# Patient Record
Sex: Female | Born: 1960 | Race: Black or African American | Hispanic: No | Smoking: Current every day smoker
Health system: Southern US, Community
[De-identification: ages and names within clinical notes are randomized; demographics above are authoritative.]

## PROBLEM LIST (undated history)

## (undated) DIAGNOSIS — I10 Essential (primary) hypertension: Secondary | ICD-10-CM

## (undated) DIAGNOSIS — E119 Type 2 diabetes mellitus without complications: Secondary | ICD-10-CM

## (undated) HISTORY — PX: CHOLECYSTECTOMY: SHX55

---

## 2019-07-08 ENCOUNTER — Emergency Department (HOSPITAL_COMMUNITY): Payer: PRIVATE HEALTH INSURANCE

## 2019-07-08 ENCOUNTER — Other Ambulatory Visit: Payer: Self-pay

## 2019-07-08 ENCOUNTER — Emergency Department (HOSPITAL_COMMUNITY)
Admission: EM | Admit: 2019-07-08 | Discharge: 2019-07-08 | Disposition: A | Discharge status: Home / Self Care | Payer: PRIVATE HEALTH INSURANCE | Attending: Emergency Medicine | Admitting: Emergency Medicine

## 2019-07-08 DIAGNOSIS — I10 Essential (primary) hypertension: Secondary | ICD-10-CM | POA: Insufficient documentation

## 2019-07-08 DIAGNOSIS — E1165 Type 2 diabetes mellitus with hyperglycemia: Secondary | ICD-10-CM | POA: Diagnosis not present

## 2019-07-08 DIAGNOSIS — Z794 Long term (current) use of insulin: Secondary | ICD-10-CM | POA: Diagnosis not present

## 2019-07-08 DIAGNOSIS — Z7982 Long term (current) use of aspirin: Secondary | ICD-10-CM | POA: Insufficient documentation

## 2019-07-08 DIAGNOSIS — Z79899 Other long term (current) drug therapy: Secondary | ICD-10-CM | POA: Diagnosis not present

## 2019-07-08 DIAGNOSIS — R079 Chest pain, unspecified: Secondary | ICD-10-CM | POA: Insufficient documentation

## 2019-07-08 DIAGNOSIS — R739 Hyperglycemia, unspecified: Secondary | ICD-10-CM

## 2019-07-08 LAB — CBC
HCT: 36.6 % (ref 36.0–46.0)
Hemoglobin: 11.4 g/dL — ABNORMAL LOW (ref 12.0–15.0)
MCH: 26.5 pg (ref 26.0–34.0)
MCHC: 31.1 g/dL (ref 30.0–36.0)
MCV: 85.1 fL (ref 80.0–100.0)
Platelets: 318 10*3/uL (ref 150–400)
RBC: 4.3 MIL/uL (ref 3.87–5.11)
RDW: 13.2 % (ref 11.5–15.5)
WBC: 8.3 10*3/uL (ref 4.0–10.5)
nRBC: 0 % (ref 0.0–0.2)

## 2019-07-08 LAB — BASIC METABOLIC PANEL
Anion gap: 7 (ref 5–15)
BUN: 23 mg/dL — ABNORMAL HIGH (ref 6–20)
CO2: 24 mmol/L (ref 22–32)
Calcium: 9 mg/dL (ref 8.9–10.3)
Chloride: 103 mmol/L (ref 98–111)
Creatinine, Ser: 0.99 mg/dL (ref 0.44–1.00)
GFR calc Af Amer: >60 mL/min (ref >60)
GFR calc non Af Amer: >60 mL/min (ref >60)
Glucose, Bld: 324 mg/dL — ABNORMAL HIGH (ref 70–99)
Potassium: 4 mmol/L (ref 3.5–5.1)
Sodium: 134 mmol/L — ABNORMAL LOW (ref 135–145)

## 2019-07-08 LAB — TROPONIN I (HIGH SENSITIVITY)
Troponin I (High Sensitivity): 7 ng/L (ref <18)
Troponin I (High Sensitivity): 8 ng/L (ref <18)

## 2019-07-08 LAB — CBG MONITORING, ED: Glucose-Capillary: 323 mg/dL — ABNORMAL HIGH (ref 70–99)

## 2019-07-08 MED ORDER — SODIUM CHLORIDE 0.9% FLUSH: 3.0000 mL | Freq: Once | INTRAVENOUS | Status: DC

## 2019-07-08 NOTE — Discharge Instructions (Signed)
See your Physician for recheck.  °

## 2019-07-08 NOTE — ED Triage Notes (Signed)
Pt is having abdominal pain. Within the last week, she is having a nagging pain. Chest pain has been going on for a while too and was being treated for a strained muscle. Has been prescribed Ibuprofen and a muscle relaxer. History of heart burn as well. Pt taking Omeprazole. Now chest pain hurts with activity.

## 2019-07-09 NOTE — ED Provider Notes (Signed)
[facility] EMERGENCY DEPARTMENT Provider Note   CSN: [id number] Arrival date & time: 07/08/19  1206     History   Chief Complaint Chief Complaint  Patient presents with  . Chest Pain    HPI Robin Chandler is a 58 y.o. female.     Patient reports she has been having pain in her chest for over a month she has been treated for a muscle strain.  Patient reports pain is in the mid chest.  Patient also reports she has a history of reflux her primary care doctor has recently started her on omeprazole.  Patient states this seems to be helping the symptoms.  Patient has pain in the mid and left chest no radiation she describes the pain as aching and constant it is worse with range of motionPatient reports she has been having pain in her chest for over a month she has been treated for a muscle strain.  Patient reports pain is in the mid chest.  Patient also reports she has a history of reflux her primary care doctor has recently started her on omeprazole.  Patient states this seems to be helping the symptoms.  Patient has pain in the mid and left chest no radiation she describes the pain as aching and constant it is worse with range of motion   Chest Pain    Chest Pain Risk factors: diabetes mellitus and hypertension     No past medical history on file.  There are no active problems to display for this patient.      OB History   No obstetric history on file.      Home Medications    Prior to Admission medications   Medication Sig Start Date End Date Taking? Authorizing Provider  aspirin EC 81 MG tablet Take 81 mg by mouth daily.   Yes [provider]  busPIRone (BUSPAR) 7.5 MG tablet Take 7.5 mg by mouth daily. 06/25/19  Yes [provider]  cyclobenzaprine (FLEXERIL) 5 MG tablet Take 5 mg by mouth 3 (three) times daily as needed for muscle spasms.  06/10/19  Yes [provider]  glimepiride (AMARYL) 4 MG tablet Take 4 mg by mouth 2 (two) times daily.  05/25/19  Yes [provider]  hydrOXYzine (ATARAX/VISTARIL) 25 MG tablet Take 25-50 mg by mouth every 6 (six) hours as needed. For anxiety 05/30/19  Yes [provider]  Insulin Glargine (BASAGLAR KWIKPEN) 100 UNIT/ML SOPN Inject 20 Units into the skin at bedtime.  06/14/19  Yes [provider]  JANUMET 50-1000 MG tablet Take 1 tablet by mouth 2 (two) times daily. 06/24/19  Yes [provider]  lisinopril (ZESTRIL) 20 MG tablet Take 20 mg by mouth daily. 06/13/19  Yes [provider]  omeprazole (PRILOSEC) 40 MG capsule Take 40 mg by mouth daily. 06/02/19  Yes [provider]  rosuvastatin (CRESTOR) 20 MG tablet Take 20 mg by mouth daily. 04/19/19  Yes [provider]  vitamin C (ASCORBIC ACID) 500 MG tablet Take 500 mg by mouth daily.   Yes [provider]  zolpidem (AMBIEN) 10 MG tablet Take 10 mg by mouth at bedtime. 06/02/19  Yes [provider]    Family History No family history on file.  Social History Social History   Tobacco Use  . Smoking status: Not on file  Substance Use Topics  . Alcohol use: Not on file  . Drug use: Not on file     Allergies   Patient has  no known allergies.   Review of Systems Review of Systems  Cardiovascular: Positive for chest pain.  All other systems reviewed and are negative.    Physical Exam Updated Vital Signs BP (!) 146/85   Pulse 87   Temp 98 F (36.7 C) (Oral)   Resp 18   SpO2 100%   Physical Exam Vitals signs and nursing note reviewed.  Constitutional:      Appearance: She is well-developed.  HENT:     Head: Normocephalic.  Neck:     Musculoskeletal: Normal range of motion and neck supple.  Cardiovascular:     Rate and Rhythm: Normal rate.     Heart sounds: Normal heart sounds. No murmur.  Pulmonary:     Effort: Pulmonary effort is normal.     Breath sounds: Normal breath sounds.  Chest:     Chest wall: Tenderness present. No mass.  Abdominal:      General: There is no distension.     Palpations: Abdomen is soft.  Musculoskeletal: Normal range of motion.  Skin:    General: Skin is warm.  Neurological:     General: No focal deficit present.     Mental Status: She is alert and oriented to person, place, and time.      ED Treatments / Results  Labs (all labs ordered are listed, but only abnormal results are displayed) Labs Reviewed  BASIC METABOLIC PANEL - Abnormal; Notable for the following components:      Result Value   Sodium 134 (*)    Glucose, Bld 324 (*)    BUN 23 (*)    All other components within normal limits  CBC - Abnormal; Notable for the following components:   Hemoglobin 11.4 (*)    All other components within normal limits  CBG MONITORING, ED - Abnormal; Notable for the following components:   Glucose-Capillary 323 (*)    All other components within normal limits  TROPONIN I (HIGH SENSITIVITY)  TROPONIN I (HIGH SENSITIVITY)    EKG EKG Interpretation  Date/Time:  Tuesday July 08 2019 12:24:07 EDT Ventricular Rate:  89 PR Interval:  132 QRS Duration: 82 QT Interval:  378 QTC Calculation: 459 R Axis:   -17 Text Interpretation:  Sinus rhythm with occasional Premature ventricular complexes Cannot rule out Anterior infarct , age undetermined Abnormal ECG No old tracing to compare Confirmed by Meridee ScoreButler, Michael [id number]) on 07/08/2019 12:35:36 PM   Radiology Dg Chest 2 View  Result Date: 07/08/2019 CLINICAL DATA:  Chest pain EXAM: CHEST - 2 VIEW COMPARISON:  None. FINDINGS: Heart and mediastinal contours are within normal limits. No focal opacities or effusions. No acute bony abnormality. IMPRESSION: No active cardiopulmonary disease. Electronically Signed   By: Charlett NoseKevin  Dover M.D.   On: 07/08/2019 12:59    Procedures Procedures (including critical care time)  Medications Ordered in ED Medications - No data to display   Initial Impression / Assessment and Plan / ED Course  I have reviewed the  triage vital signs and the nursing notes.  Pertinent labs & imaging results that were available during my care of the patient were reviewed by me and considered in my medical decision making (see chart for details).        MDM: She has elevated glucose to 324.  Counseled on elevated glucose patient has 2- high-sensitivity troponins.  Chest x-ray is normal EKG no acute abnormality.  Patient has concerns about having coronary artery disease I do not think her chest pain is anginal  however given her risk factors she needs to follow-up for further evaluation patient is advised to see her primary physician for recheck and discuss valuation  Final Clinical Impressions(s) / ED Diagnoses   Final diagnoses:  Nonspecific chest pain  Hyperglycemia    ED Discharge Orders    None    BP (!) 146/85   Pulse 87   Temp 98 F (36.7 C) (Oral)   Resp 18   SpO2 100%  An After Visit Summary was printed and given to the patient.    Elson Areas, [place] 07/09/19 1015    Terrilee Files, MD 07/09/19 [phone]

## 2019-08-21 ENCOUNTER — Other Ambulatory Visit: Payer: Self-pay | Admitting: Physician Assistant

## 2019-08-21 DIAGNOSIS — Z1231 Encounter for screening mammogram for malignant neoplasm of breast: Secondary | ICD-10-CM

## 2020-11-16 ENCOUNTER — Encounter (HOSPITAL_COMMUNITY): Payer: Self-pay | Admitting: *Deleted

## 2020-11-16 ENCOUNTER — Other Ambulatory Visit: Payer: Self-pay

## 2020-11-16 ENCOUNTER — Emergency Department (HOSPITAL_COMMUNITY)
Admission: EM | Admit: 2020-11-16 | Discharge: 2020-11-16 | Disposition: A | Discharge status: Home / Self Care | Payer: PRIVATE HEALTH INSURANCE | Attending: Emergency Medicine | Admitting: Emergency Medicine

## 2020-11-16 DIAGNOSIS — R0602 Shortness of breath: Secondary | ICD-10-CM | POA: Insufficient documentation

## 2020-11-16 DIAGNOSIS — Z20822 Contact with and (suspected) exposure to covid-19: Secondary | ICD-10-CM | POA: Diagnosis not present

## 2020-11-16 DIAGNOSIS — R451 Restlessness and agitation: Secondary | ICD-10-CM | POA: Insufficient documentation

## 2020-11-16 DIAGNOSIS — Z5321 Procedure and treatment not carried out due to patient leaving prior to being seen by health care provider: Secondary | ICD-10-CM | POA: Insufficient documentation

## 2020-11-16 HISTORY — DX: Type 2 diabetes mellitus without complications: E11.9

## 2020-11-16 HISTORY — DX: Essential (primary) hypertension: I10

## 2020-11-16 LAB — POC SARS CORONAVIRUS 2 AG -  ED: SARS Coronavirus 2 Ag: NEGATIVE

## 2020-11-16 LAB — CBG MONITORING, ED: Glucose-Capillary: 203 mg/dL — ABNORMAL HIGH (ref 70–99)

## 2020-11-16 NOTE — ED Triage Notes (Signed)
Did not stay in triage to finish answering questions

## 2020-11-16 NOTE — ED Notes (Signed)
Per registration pt has left the facility °

## 2020-11-16 NOTE — ED Notes (Signed)
Patient refused ekg in triage

## 2020-11-16 NOTE — ED Triage Notes (Signed)
Would not consent to an EKG  In triage, states she is done, states she needs to lay down, advised no beds available at present. Wondered if beds are available at [facility], advised they are usually busy.

## 2020-11-16 NOTE — ED Triage Notes (Signed)
Very restless and hyperventilating in triage, advised to try to slow her breathing down, states she has been short of breath for the past week

## 2021-05-24 IMAGING — DX DG CHEST 2V
2 series · 2 of 2 positions shown · non-contrast
Comparison: None.

CLINICAL DATA: Chest pain

EXAM:
CHEST - 2 VIEW

[chest pa]
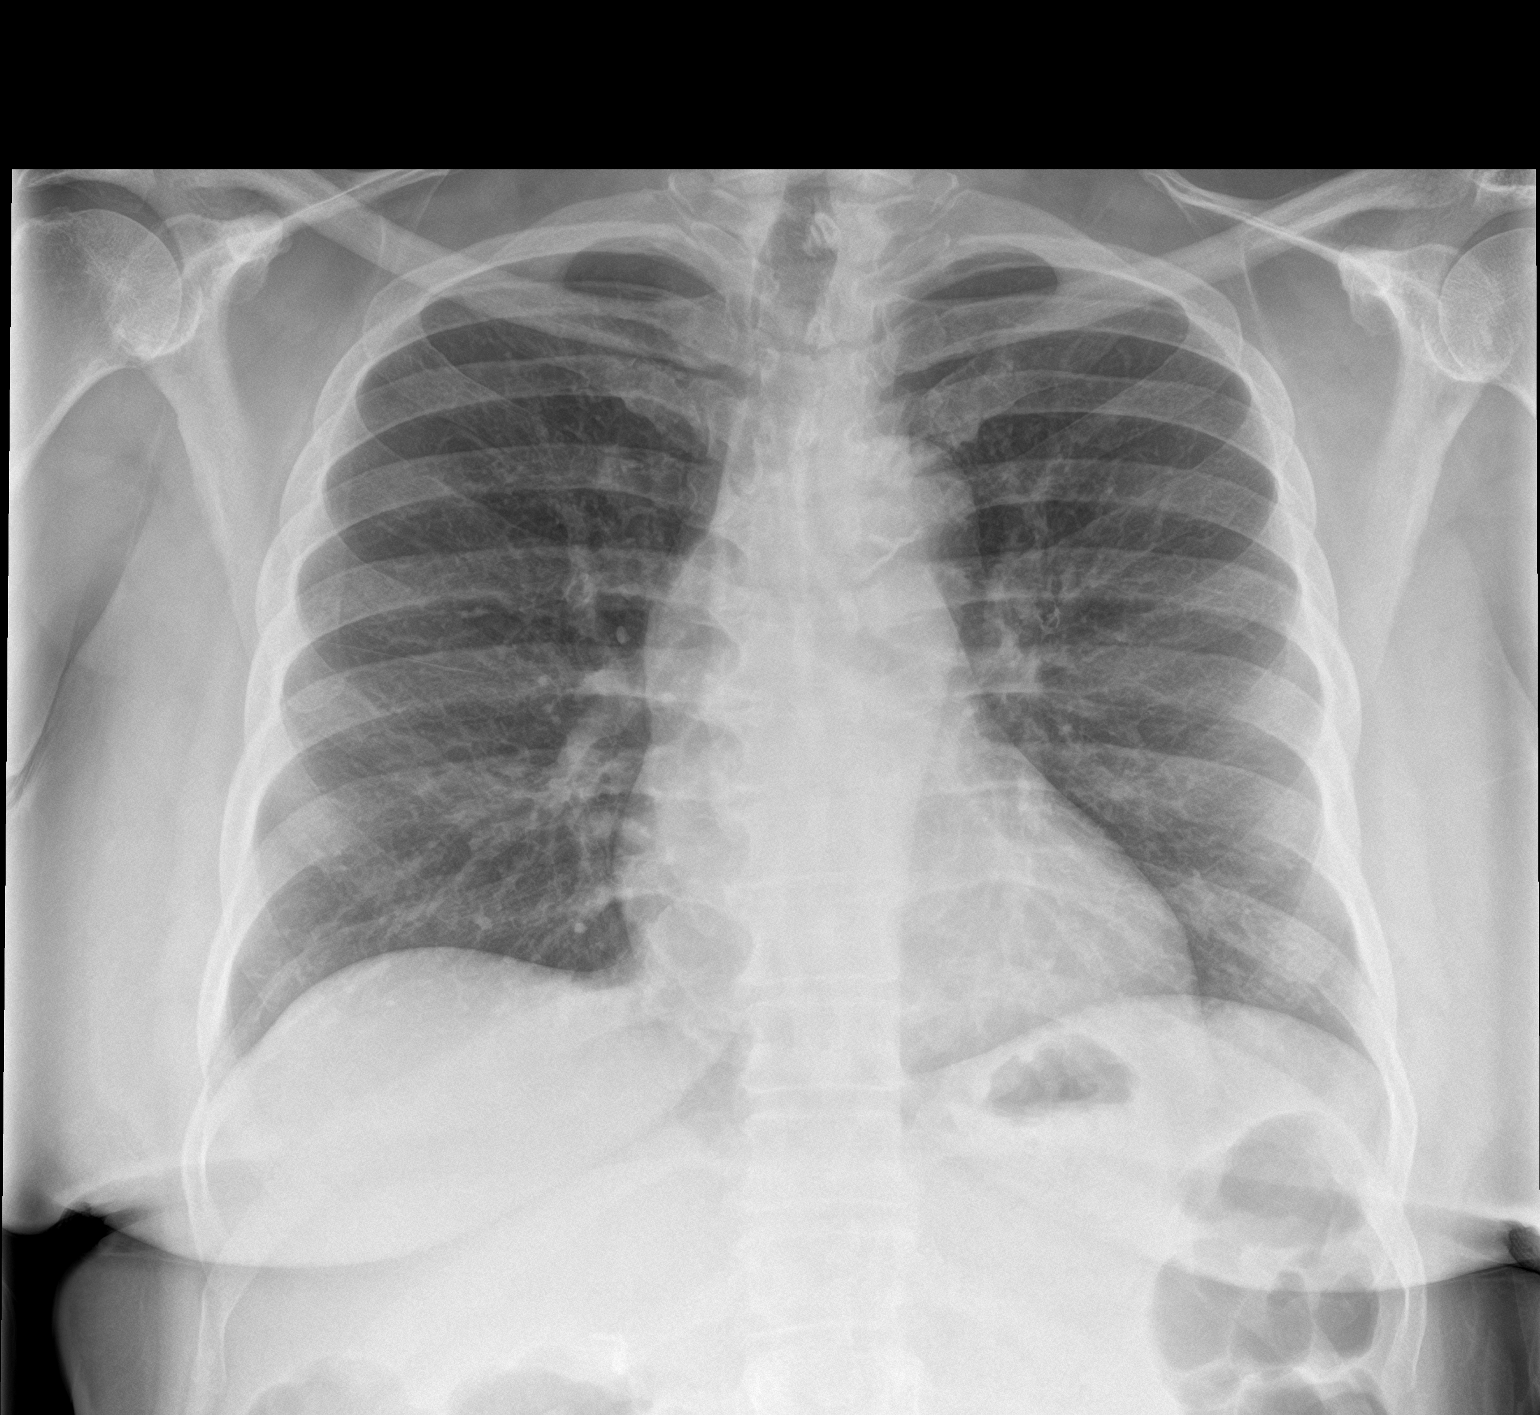

[chest lat]
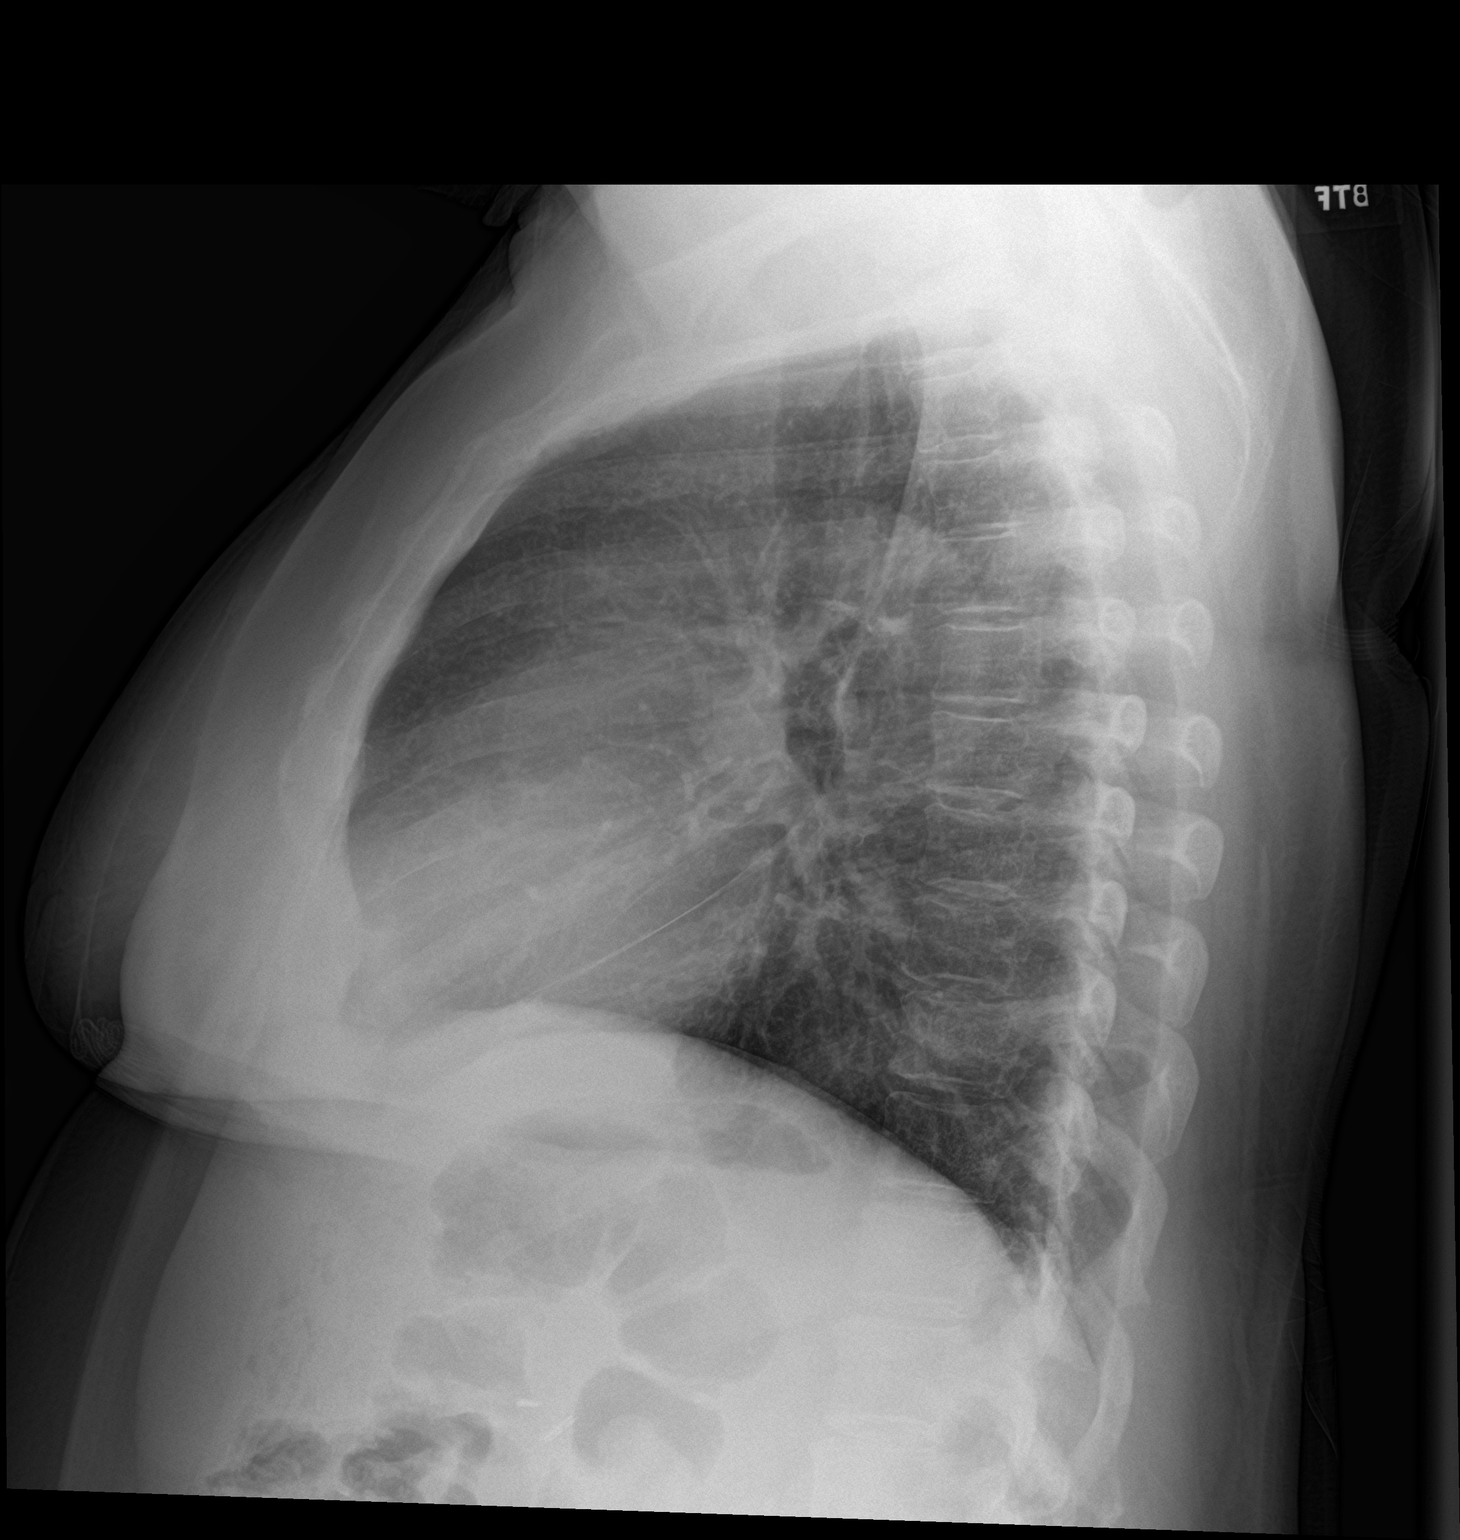

[2 of 2 positions shown; findings below may reference images not displayed]

FINDINGS: Heart and mediastinal contours are within normal limits. No focal
opacities or effusions. No acute bony abnormality.
IMPRESSION: No active cardiopulmonary disease.

## 2023-02-04 ENCOUNTER — Emergency Department (HOSPITAL_COMMUNITY): Payer: Self-pay

## 2023-02-04 ENCOUNTER — Observation Stay (HOSPITAL_COMMUNITY)
Admission: EM | Admit: 2023-02-04 | Discharge: 2023-02-05 | Disposition: A | Discharge status: Home / Self Care | Payer: Self-pay | Attending: Family Medicine | Admitting: Family Medicine

## 2023-02-04 ENCOUNTER — Other Ambulatory Visit: Payer: Self-pay

## 2023-02-04 ENCOUNTER — Encounter (HOSPITAL_COMMUNITY): Payer: Self-pay

## 2023-02-04 DIAGNOSIS — R4701 Aphasia: Secondary | ICD-10-CM

## 2023-02-04 DIAGNOSIS — R7989 Other specified abnormal findings of blood chemistry: Secondary | ICD-10-CM | POA: Insufficient documentation

## 2023-02-04 DIAGNOSIS — Z7984 Long term (current) use of oral hypoglycemic drugs: Secondary | ICD-10-CM | POA: Insufficient documentation

## 2023-02-04 DIAGNOSIS — I10 Essential (primary) hypertension: Secondary | ICD-10-CM | POA: Insufficient documentation

## 2023-02-04 DIAGNOSIS — E785 Hyperlipidemia, unspecified: Secondary | ICD-10-CM | POA: Insufficient documentation

## 2023-02-04 DIAGNOSIS — I639 Cerebral infarction, unspecified: Principal | ICD-10-CM | POA: Insufficient documentation

## 2023-02-04 DIAGNOSIS — N179 Acute kidney failure, unspecified: Secondary | ICD-10-CM | POA: Insufficient documentation

## 2023-02-04 DIAGNOSIS — F17200 Nicotine dependence, unspecified, uncomplicated: Secondary | ICD-10-CM | POA: Insufficient documentation

## 2023-02-04 DIAGNOSIS — Z7982 Long term (current) use of aspirin: Secondary | ICD-10-CM | POA: Insufficient documentation

## 2023-02-04 DIAGNOSIS — Z794 Long term (current) use of insulin: Secondary | ICD-10-CM | POA: Insufficient documentation

## 2023-02-04 DIAGNOSIS — E781 Pure hyperglyceridemia: Secondary | ICD-10-CM | POA: Insufficient documentation

## 2023-02-04 DIAGNOSIS — R944 Abnormal results of kidney function studies: Secondary | ICD-10-CM | POA: Insufficient documentation

## 2023-02-04 DIAGNOSIS — E119 Type 2 diabetes mellitus without complications: Secondary | ICD-10-CM | POA: Insufficient documentation

## 2023-02-04 LAB — CBC WITH DIFFERENTIAL/PLATELET
Abs Immature Granulocytes: 0.02 10*3/uL (ref 0.00–0.07)
Basophils Absolute: 0 10*3/uL (ref 0.0–0.1)
Basophils Relative: 0 %
Eosinophils Absolute: 0.1 10*3/uL (ref 0.0–0.5)
Eosinophils Relative: 2 %
HCT: 38.9 % (ref 36.0–46.0)
Hemoglobin: 12.4 g/dL (ref 12.0–15.0)
Immature Granulocytes: 0 %
Lymphocytes Relative: 41 %
Lymphs Abs: 3.6 10*3/uL (ref 0.7–4.0)
MCH: 28.2 pg (ref 26.0–34.0)
MCHC: 31.9 g/dL (ref 30.0–36.0)
MCV: 88.6 fL (ref 80.0–100.0)
Monocytes Absolute: 0.3 10*3/uL (ref 0.1–1.0)
Monocytes Relative: 4 %
Neutro Abs: 4.6 10*3/uL (ref 1.7–7.7)
Neutrophils Relative %: 53 %
Platelets: 258 10*3/uL (ref 150–400)
RBC: 4.39 MIL/uL (ref 3.87–5.11)
RDW: 13.8 % (ref 11.5–15.5)
WBC: 8.7 10*3/uL (ref 4.0–10.5)
nRBC: 0 % (ref 0.0–0.2)

## 2023-02-04 LAB — COMPREHENSIVE METABOLIC PANEL
ALT: 14 U/L (ref 0–44)
AST: 16 U/L (ref 15–41)
Albumin: 4.1 g/dL (ref 3.5–5.0)
Alkaline Phosphatase: 61 U/L (ref 38–126)
Anion gap: 11 (ref 5–15)
BUN: 36 mg/dL — ABNORMAL HIGH (ref 8–23)
CO2: 20 mmol/L — ABNORMAL LOW (ref 22–32)
Calcium: 9.3 mg/dL (ref 8.9–10.3)
Chloride: 104 mmol/L (ref 98–111)
Creatinine, Ser: 1.6 mg/dL — ABNORMAL HIGH (ref 0.44–1.00)
GFR, Estimated: 36 mL/min — ABNORMAL LOW (ref >60)
Glucose, Bld: 226 mg/dL — ABNORMAL HIGH (ref 70–99)
Potassium: 4.7 mmol/L (ref 3.5–5.1)
Sodium: 135 mmol/L (ref 135–145)
Total Bilirubin: 0.5 mg/dL (ref 0.3–1.2)
Total Protein: 7.7 g/dL (ref 6.5–8.1)

## 2023-02-04 LAB — GLUCOSE, CAPILLARY: Glucose-Capillary: 258 mg/dL — ABNORMAL HIGH (ref 70–99)

## 2023-02-04 MED ORDER — SENNOSIDES-DOCUSATE SODIUM 8.6-50 MG PO TABS: 1.0000 | ORAL_TABLET | Freq: Every evening | ORAL | Status: DC | PRN

## 2023-02-04 MED ORDER — CLOPIDOGREL BISULFATE 75 MG PO TABS
75.0000 mg | ORAL_TABLET | Freq: Every day | ORAL | Status: DC
  Administered 2023-02-04: 75 mg via ORAL
  Filled 2023-02-04 (×2): qty 1

## 2023-02-04 MED ORDER — ROSUVASTATIN CALCIUM 20 MG PO TABS
20.0000 mg | ORAL_TABLET | Freq: Every day | ORAL | Status: DC
  Filled 2023-02-04: qty 1

## 2023-02-04 MED ORDER — STROKE: EARLY STAGES OF RECOVERY BOOK: Freq: Once | Status: AC

## 2023-02-04 MED ORDER — ZOLPIDEM TARTRATE 5 MG PO TABS
5.0000 mg | ORAL_TABLET | Freq: Every day | ORAL | Status: DC
  Administered 2023-02-04: 5 mg via ORAL
  Filled 2023-02-04: qty 1

## 2023-02-04 MED ORDER — PANTOPRAZOLE SODIUM 40 MG PO TBEC
40.0000 mg | DELAYED_RELEASE_TABLET | Freq: Every day | ORAL | Status: DC
  Filled 2023-02-04: qty 1

## 2023-02-04 MED ORDER — ACETAMINOPHEN 160 MG/5ML PO SOLN: 650.0000 mg | ORAL | Status: DC | PRN

## 2023-02-04 MED ORDER — INSULIN ASPART 100 UNIT/ML IJ SOLN
0.0000 [IU] | Freq: Three times a day (TID) | INTRAMUSCULAR | Status: DC
  Administered 2023-02-05 (×2): 5 [IU] via SUBCUTANEOUS

## 2023-02-04 MED ORDER — ACETAMINOPHEN 650 MG RE SUPP: 650.0000 mg | RECTAL | Status: DC | PRN

## 2023-02-04 MED ORDER — INSULIN GLARGINE-YFGN 100 UNIT/ML ~~LOC~~ SOLN
10.0000 [IU] | Freq: Every day | SUBCUTANEOUS | Status: DC
  Administered 2023-02-04: 10 [IU] via SUBCUTANEOUS
  Filled 2023-02-04 (×2): qty 0.1

## 2023-02-04 MED ORDER — INSULIN ASPART 100 UNIT/ML IJ SOLN
0.0000 [IU] | Freq: Every day | INTRAMUSCULAR | Status: DC
  Administered 2023-02-04: 3 [IU] via SUBCUTANEOUS

## 2023-02-04 MED ORDER — HYDROXYZINE HCL 25 MG PO TABS
25.0000 mg | ORAL_TABLET | Freq: Four times a day (QID) | ORAL | Status: DC | PRN
  Administered 2023-02-05: 25 mg via ORAL
  Filled 2023-02-04: qty 1

## 2023-02-04 MED ORDER — HEPARIN SODIUM (PORCINE) 5000 UNIT/ML IJ SOLN
5000.0000 [IU] | Freq: Three times a day (TID) | INTRAMUSCULAR | Status: DC
  Administered 2023-02-05 (×2): 5000 [IU] via SUBCUTANEOUS
  Filled 2023-02-04 (×2): qty 1

## 2023-02-04 MED ORDER — IOHEXOL 350 MG/ML SOLN
60.0000 mL | Freq: Once | INTRAVENOUS | Status: AC | PRN
  Administered 2023-02-04: 60 mL via INTRAVENOUS

## 2023-02-04 MED ORDER — ASPIRIN 325 MG PO TABS
325.0000 mg | ORAL_TABLET | Freq: Once | ORAL | Status: AC
  Administered 2023-02-04: 325 mg via ORAL
  Filled 2023-02-04: qty 1

## 2023-02-04 MED ORDER — BUSPIRONE HCL 5 MG PO TABS: 7.5000 mg | ORAL_TABLET | Freq: Every day | ORAL | Status: DC

## 2023-02-04 MED ORDER — ACETAMINOPHEN 325 MG PO TABS: 650.0000 mg | ORAL_TABLET | ORAL | Status: DC | PRN

## 2023-02-04 MED ORDER — ASPIRIN 81 MG PO TBEC
81.0000 mg | DELAYED_RELEASE_TABLET | Freq: Every day | ORAL | Status: DC
  Filled 2023-02-04: qty 1

## 2023-02-04 MED ORDER — LISINOPRIL 10 MG PO TABS
20.0000 mg | ORAL_TABLET | Freq: Every day | ORAL | Status: DC
  Filled 2023-02-04: qty 2

## 2023-02-04 NOTE — ED Notes (Signed)
Patient denies chest pain or shortness of breath. Continues to left sided weakness to extremities.

## 2023-02-04 NOTE — ED Provider Notes (Signed)
[place] EMERGENCY DEPARTMENT AT [facility] Provider Note   CSN: [id number] Arrival date & time: 02/04/23  1452     History {Add pertinent medical, surgical, social history, OB history to HPI:1} Chief Complaint  Patient presents with   Visual Field Change   Weakness   Aphasia    Robin Chandler is a 61 y.o. female.  Patient complains of slurred speech and weakness in left arm and left leg.  According to the daughter she called her mother yesterday around 35 and the patient had slurred speech at that time.  Patient has history of hypertension and diabetes   Weakness      Home Medications Prior to Admission medications   Medication Sig Start Date End Date Taking? Authorizing Provider  aspirin EC 81 MG tablet Take 81 mg by mouth daily.    [provider]  busPIRone (BUSPAR) 7.5 MG tablet Take 7.5 mg by mouth daily. 06/25/19   [provider]  cyclobenzaprine (FLEXERIL) 5 MG tablet Take 5 mg by mouth 3 (three) times daily as needed for muscle spasms.  06/10/19   [provider]  glimepiride (AMARYL) 4 MG tablet Take 4 mg by mouth 2 (two) times daily. 05/25/19   [provider]  hydrOXYzine (ATARAX/VISTARIL) 25 MG tablet Take 25-50 mg by mouth every 6 (six) hours as needed. For anxiety 05/30/19   [provider]  Insulin Glargine (BASAGLAR KWIKPEN) 100 UNIT/ML SOPN Inject 20 Units into the skin at bedtime.  06/14/19   [provider]  JANUMET 50-1000 MG tablet Take 1 tablet by mouth 2 (two) times daily. 06/24/19   [provider]  lisinopril (ZESTRIL) 20 MG tablet Take 20 mg by mouth daily. 06/13/19   [provider]  omeprazole (PRILOSEC) 40 MG capsule Take 40 mg by mouth daily. 06/02/19   [provider]  rosuvastatin (CRESTOR) 20 MG tablet Take 20 mg by mouth daily. 04/19/19   [provider]  vitamin C (ASCORBIC ACID) 500 MG tablet Take 500 mg by mouth daily.    [provider]   zolpidem (AMBIEN) 10 MG tablet Take 10 mg by mouth at bedtime. 06/02/19   [provider]      Allergies    Patient has no known allergies.    Review of Systems   Review of Systems  Neurological:  Positive for weakness.    Physical Exam Updated Vital Signs BP 129/61   Pulse 66   Temp 98.2 F (36.8 C) (Oral)   Resp 16   Ht 5\' 4"  (1.626 m)   Wt 76.2 kg   SpO2 98%   BMI 28.84 kg/m  Physical Exam  ED Results / Procedures / Treatments   Labs (all labs ordered are listed, but only abnormal results are displayed) Labs Reviewed  COMPREHENSIVE METABOLIC PANEL - Abnormal; Notable for the following components:      Result Value   CO2 20 (*)    Glucose, Bld 226 (*)    BUN 36 (*)    Creatinine, Ser 1.60 (*)    GFR, Estimated 36 (*)    All other components within normal limits  CBC WITH DIFFERENTIAL/PLATELET    EKG None  Radiology CT ANGIO HEAD NECK W WO CM  Result Date: 02/04/2023 CLINICAL DATA:  Visual field changes, weakness, aphasia, slurred speech EXAM: CT ANGIOGRAPHY HEAD AND NECK WITH AND WITHOUT CONTRAST TECHNIQUE: Multidetector CT imaging of the head and neck was performed using the standard protocol during bolus administration  of intravenous contrast. Multiplanar CT image reconstructions and MIPs were obtained to evaluate the vascular anatomy. Carotid stenosis measurements (when applicable) are obtained utilizing NASCET criteria, using the distal internal carotid diameter as the denominator. RADIATION DOSE REDUCTION: This exam was performed according to the departmental dose-optimization program which includes automated exposure control, adjustment of the mA and/or kV according to patient size and/or use of iterative reconstruction technique. CONTRAST:  60mL OMNIPAQUE IOHEXOL 350 MG/ML SOLN COMPARISON:  No prior CTA available, correlation is made with CT head 02/04/2023 FINDINGS: CT HEAD FINDINGS For noncontrast findings, please see same day CT head. CTA NECK  FINDINGS Aortic arch: Two-vessel arch with a common origin of the brachiocephalic and left common carotid arteries. Imaged portion shows no evidence of aneurysm or dissection. No significant stenosis of the major arch vessel origins. Mild aortic atherosclerosis. Right carotid system: No evidence of dissection, occlusion, or hemodynamically significant stenosis (greater than 50%). Atherosclerotic disease at the bifurcation and in the proximal ICA is not hemodynamically significant. Left carotid system: No evidence of dissection, occlusion, or hemodynamically significant stenosis (greater than 50%). Atherosclerotic disease at the bifurcation and in the proximal ICA is not hemodynamically significant. Vertebral arteries: Mild stenosis in the left V1 segment. No other hemodynamically significant stenosis in the vertebral arteries. No evidence Skeleton: No acute osseous abnormality. Degenerative changes in the cervical spine. Diffuse osseous sclerosis is favored to have a metabolic etiology. Other neck: Negative. Upper chest: No focal pulmonary opacity or pleural effusion. Review of the MIP images confirms the above findings CTA HEAD FINDINGS Anterior circulation: Both internal carotid arteries are patent to the termini, with moderate to severe stenosis in the bilateral cavernous segment and mild stenosis in the bilateral supraclinoid segments. A1 segments patent. Normal anterior communicating artery. Anterior cerebral arteries are patent to their distal aspects without significant stenosis. No M1 stenosis or occlusion. MCA branches perfused to their distal aspects without significant stenosis. Posterior circulation: Vertebral arteries patent to the vertebrobasilar junction without significant stenosis. Posterior inferior cerebellar arteries patent proximally. Basilar patent to its distal aspect without significant stenosis. Superior cerebellar arteries patent proximally. Patent P1 segments. Mild stenosis in the proximal  right P2 (series 6, image 113). PCAs are otherwise irregular but perfused to their distal aspects. The left posterior communicating artery is patent. Venous sinuses: As permitted by contrast timing, patent. Anatomic variants: None significant. Review of the MIP images confirms the above findings IMPRESSION: 1. No intracranial large vessel occlusion. 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 2. Mild stenosis in the left V1 segment. No other hemodynamically significant stenosis in the neck. 3. Aortic atherosclerosis. Aortic Atherosclerosis (ICD10-I70.0). Electronically Signed   By: Wiliam Ke M.D.   On: 02/04/2023 19:05   CT Head Wo Contrast  Result Date: 02/04/2023 CLINICAL DATA:  Neuro deficit. Stroke suspected. Weakness and aphasia. EXAM: CT HEAD WITHOUT CONTRAST TECHNIQUE: Contiguous axial images were obtained from the base of the skull through the vertex without intravenous contrast. RADIATION DOSE REDUCTION: This exam was performed according to the departmental dose-optimization program which includes automated exposure control, adjustment of the mA and/or kV according to patient size and/or use of iterative reconstruction technique. COMPARISON:  None Available. FINDINGS: Brain: No subdural, epidural, or subarachnoid hemorrhage. There is a small lacunar infarct in the right cerebellar hemisphere on series 2, image 10 which is nonacute appearance. Suspected nonacute lacunar infarct in the pons. The cerebellum and brainstem are otherwise normal. Basal cisterns  are widely patent. No mass effect or midline shift. White matter changes are identified. No acute ischemia or infarct noted. Vascular: Calcified atherosclerotic changes identified in the intracranial carotids. Skull: Normal. Negative for fracture or focal lesion. Sinuses/Orbits: Mucosal thickening is identified in the right maxillary sinus. Other: None. IMPRESSION: 1. No acute  intracranial abnormalities. 2. 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 Suspected nonacute lacunar infarct in the pons. 3. White matter changes consistent with chronic microvascular ischemia. 4. Calcified atherosclerotic changes in the intracranial carotids. 5. Mucosal thickening in the right maxillary sinus. Electronically Signed   By: Gerome Sam III M.D.   On: 02/04/2023 15:55    Procedures Procedures  {Document cardiac monitor, telemetry assessment procedure when appropriate:1}  Medications Ordered in ED Medications  clopidogrel (PLAVIX) tablet 75 mg (75 mg Oral Given 02/04/23 1931)  iohexol (OMNIPAQUE) 350 MG/ML injection 60 mL (60 mLs Intravenous Contrast Given 02/04/23 1657)  aspirin tablet 325 mg (325 mg Oral Given 02/04/23 1931)    ED Course/ Medical Decision Making/ A&P   {  CRITICAL CARE Performed by: Bethann Berkshire Total critical care time: 45 minutes Critical care time was exclusive of separately billable procedures and treating other patients. Critical care was necessary to treat or prevent imminent or life-threatening deterioration. Critical care was time spent personally by me on the following activities: development of treatment plan with patient and/or surrogate as well as nursing, discussions with consultants, evaluation of patient's response to treatment, examination of patient, obtaining history from patient or surrogate, ordering and performing treatments and interventions, ordering and review of laboratory studies, ordering and review of radiographic studies, pulse oximetry and re-evaluation of patient's condition.   Patient CT head shows no acute stroke.  CT angio of the head and neck shows no large vessel occlusion.  I spoke with neurology Dr. Selina Cooley and she felt like the patient can be admitted to [facility] with neurology consult tomorrow.  Patient can have aspirin and Plavix and permissible blood pressure  elevation Click here for ABCD2, HEART and other calculatorsREFRESH Note before signing :1}                          Medical Decision Making Amount and/or Complexity of Data Reviewed Labs: ordered. Radiology: ordered. ECG/medicine tests: ordered.  Risk OTC drugs. Prescription drug management. Decision regarding hospitalization.   Slurred speech and weakness in left arm and left leg most likely related to acute stroke  {Document critical care time when appropriate:1} {Document review of labs and clinical decision tools ie heart score, Chads2Vasc2 etc:1}  {Document your independent review of radiology images, and any outside records:1} {Document your discussion with family members, caretakers, and with consultants:1} {Document social determinants of health affecting pt's care:1} {Document your decision making why or why not admission, treatments were needed:1} Final Clinical Impression(s) / ED Diagnoses Final diagnoses:  Aphasia    Rx / DC Orders ED Discharge Orders     None

## 2023-02-04 NOTE — ED Triage Notes (Signed)
Patient states yesterday at 6, her daughter noticed she had slurred speech when they were talking on the phone.. This morning patient reports feeling generally weak as well as blurry vision.

## 2023-02-05 ENCOUNTER — Other Ambulatory Visit (HOSPITAL_COMMUNITY): Payer: Self-pay | Admitting: *Deleted

## 2023-02-05 ENCOUNTER — Observation Stay (HOSPITAL_BASED_OUTPATIENT_CLINIC_OR_DEPARTMENT_OTHER): Payer: Self-pay

## 2023-02-05 ENCOUNTER — Observation Stay (HOSPITAL_COMMUNITY): Payer: Self-pay

## 2023-02-05 DIAGNOSIS — I6389 Other cerebral infarction: Secondary | ICD-10-CM

## 2023-02-05 DIAGNOSIS — E119 Type 2 diabetes mellitus without complications: Secondary | ICD-10-CM

## 2023-02-05 DIAGNOSIS — F17200 Nicotine dependence, unspecified, uncomplicated: Secondary | ICD-10-CM | POA: Insufficient documentation

## 2023-02-05 DIAGNOSIS — Z794 Long term (current) use of insulin: Secondary | ICD-10-CM

## 2023-02-05 DIAGNOSIS — I639 Cerebral infarction, unspecified: Secondary | ICD-10-CM

## 2023-02-05 DIAGNOSIS — I10 Essential (primary) hypertension: Secondary | ICD-10-CM | POA: Insufficient documentation

## 2023-02-05 DIAGNOSIS — E1165 Type 2 diabetes mellitus with hyperglycemia: Secondary | ICD-10-CM

## 2023-02-05 DIAGNOSIS — R7989 Other specified abnormal findings of blood chemistry: Secondary | ICD-10-CM

## 2023-02-05 LAB — CBC
HCT: 37.8 % (ref 36.0–46.0)
Hemoglobin: 12.2 g/dL (ref 12.0–15.0)
MCH: 28.5 pg (ref 26.0–34.0)
MCHC: 32.3 g/dL (ref 30.0–36.0)
MCV: 88.3 fL (ref 80.0–100.0)
Platelets: 257 10*3/uL (ref 150–400)
RBC: 4.28 MIL/uL (ref 3.87–5.11)
RDW: 13.7 % (ref 11.5–15.5)
WBC: 9.1 10*3/uL (ref 4.0–10.5)
nRBC: 0 % (ref 0.0–0.2)

## 2023-02-05 LAB — COMPREHENSIVE METABOLIC PANEL
ALT: 14 U/L (ref 0–44)
AST: 13 U/L — ABNORMAL LOW (ref 15–41)
Albumin: 3.8 g/dL (ref 3.5–5.0)
Alkaline Phosphatase: 67 U/L (ref 38–126)
Anion gap: 11 (ref 5–15)
BUN: 38 mg/dL — ABNORMAL HIGH (ref 8–23)
CO2: 20 mmol/L — ABNORMAL LOW (ref 22–32)
Calcium: 9.2 mg/dL (ref 8.9–10.3)
Chloride: 104 mmol/L (ref 98–111)
Creatinine, Ser: 1.25 mg/dL — ABNORMAL HIGH (ref 0.44–1.00)
GFR, Estimated: 49 mL/min — ABNORMAL LOW (ref >60)
Glucose, Bld: 189 mg/dL — ABNORMAL HIGH (ref 70–99)
Potassium: 4.2 mmol/L (ref 3.5–5.1)
Sodium: 135 mmol/L (ref 135–145)
Total Bilirubin: 0.6 mg/dL (ref 0.3–1.2)
Total Protein: 7.2 g/dL (ref 6.5–8.1)

## 2023-02-05 LAB — ECHOCARDIOGRAM COMPLETE
AR max vel: 1.22 cm2
AV Area VTI: 1.33 cm2
AV Area mean vel: 1.26 cm2
AV Mean grad: 15 mmHg
AV Peak grad: 26.1 mmHg
Ao pk vel: 2.56 m/s
Area-P 1/2: 2.91 cm2
Height: 63 in
P 1/2 time: 437 msec
S' Lateral: 3.2 cm
Weight: 2486.79 oz

## 2023-02-05 LAB — LIPID PANEL
Cholesterol: 161 mg/dL (ref 0–200)
HDL: 27 mg/dL — ABNORMAL LOW (ref >40)
LDL Cholesterol: UNDETERMINED mg/dL (ref 0–99)
Total CHOL/HDL Ratio: 6 RATIO
Triglycerides: 419 mg/dL — ABNORMAL HIGH (ref <150)
VLDL: UNDETERMINED mg/dL (ref 0–40)

## 2023-02-05 LAB — HEMOGLOBIN A1C
Hgb A1c MFr Bld: 9.1 % — ABNORMAL HIGH (ref 4.8–5.6)
Mean Plasma Glucose: 214.47 mg/dL

## 2023-02-05 LAB — GLUCOSE, CAPILLARY
Glucose-Capillary: 233 mg/dL — ABNORMAL HIGH (ref 70–99)
Glucose-Capillary: 246 mg/dL — ABNORMAL HIGH (ref 70–99)

## 2023-02-05 LAB — LDL CHOLESTEROL, DIRECT: Direct LDL: 80 mg/dL (ref 0–99)

## 2023-02-05 LAB — HIV ANTIBODY (ROUTINE TESTING W REFLEX): HIV Screen 4th Generation wRfx: NONREACTIVE

## 2023-02-05 MED ORDER — CLOPIDOGREL BISULFATE 75 MG PO TABS: 75.0000 mg | ORAL_TABLET | Freq: Every day | ORAL | 2 refills | Status: AC

## 2023-02-05 MED ORDER — CLOPIDOGREL BISULFATE 75 MG PO TABS: 75.0000 mg | ORAL_TABLET | Freq: Every day | ORAL | Status: DC

## 2023-02-05 MED ORDER — PANTOPRAZOLE SODIUM 40 MG PO TBEC: 40.0000 mg | DELAYED_RELEASE_TABLET | Freq: Every day | ORAL | 2 refills | Status: AC

## 2023-02-05 MED ORDER — ASPIRIN EC 81 MG PO TBEC: 81.0000 mg | DELAYED_RELEASE_TABLET | Freq: Every day | ORAL | 0 refills | Status: AC

## 2023-02-05 MED ORDER — BUSPIRONE HCL 5 MG PO TABS
7.5000 mg | ORAL_TABLET | Freq: Every day | ORAL | Status: DC
  Administered 2023-02-05: 7.5 mg via ORAL
  Filled 2023-02-05: qty 2

## 2023-02-05 NOTE — Evaluation (Signed)
Physical Therapy Evaluation Patient Details Name: Bayli Quesinberry MRN: [id number] DOB: [birth year] Today's Date: 02/05/2023  History of Present Illness  Robin Chandler is a 62 y.o. female with medical history significant of diabetes mellitus type 2, hypertension, no other known medical history, presents to ED with a chief complaint of left-sided weakness.  Patient reports left-sided weakness started on the same day as presentation.  It was gradual in onset per her report but it sounds like it had already started when she woke up in the morning.  She had some dizziness in the morning and took time to regroup.  At 1:00 she was talking to her daughter on the phone when her daughter said that she had had slurred speech.  She reports when she tried to ambulate she was dragging her left foot but able to get around the house.  Was the symptoms that brought them in the ED.  Patient does not explain that yesterday she was not feeling quite herself.  She was overly fatigued, and had generalized weakness.  She did not think the weakness was asymmetric yesterday.  Family had traveled a lot the day before and she thought that she was just tired and needed rest.  Patient reports associated symptoms today included blurry vision that went away around 4 PM, and trouble with word finding.  She did not have any trouble with swallowing did not have any drooling.  She denies any numbness.  She denies tinnitus.  She reports she has never had symptoms like these before.  Patient is very concerned about how she could possibly have old strokes on her CT scan when she has never had symptoms of stroke.  We talked about this extensively.   Clinical Impression  Patient presents with mild weakness on left side mostly affecting LUE , functioning near baseline for functional mobility and gait demonstrating good return for bed mobility, transfers and ambulating in room/hallway without loss loss of balance.  Patient tolerated sitting up in  wheelchair to be taken to a procedure by nursing staff after therapy. Plan:  Patient discharged from physical therapy to care of nursing for ambulation daily as tolerated for length of stay.         Recommendations for follow up therapy are one component of a multi-disciplinary discharge planning process, led by the attending physician.  Recommendations may be updated based on patient status, additional functional criteria and insurance authorization.  Follow Up Recommendations       Assistance Recommended at Discharge Set up Supervision/Assistance  Patient can return home with the following  A little help with walking and/or transfers;Assistance with cooking/housework;Help with stairs or ramp for entrance    Equipment Recommendations None recommended by PT  Recommendations for Other Services       Functional Status Assessment Patient has had a recent decline in their functional status and demonstrates the ability to make significant improvements in function in a reasonable and predictable amount of time.     Precautions / Restrictions Precautions Precautions: Fall Restrictions Weight Bearing Restrictions: No      Mobility  Bed Mobility Overal bed mobility: Independent                  Transfers Overall transfer level: Modified independent                 General transfer comment: slightly labored movement    Ambulation/Gait Ambulation/Gait assistance: Modified independent (Device/Increase time), Supervision Gait Distance (Feet): 120 Feet Assistive device: None Gait Pattern/deviations:  WFL(Within Functional Limits) Gait velocity: decreased     General Gait Details: grossly WFL with slightly labored movement with mildly decreased LLE step/stride length without loss of balance ambulating in room and hallway  Stairs            Wheelchair Mobility    Modified Rankin (Stroke Patients Only)       Balance Overall balance assessment: Mild  deficits observed, not formally tested                                           Pertinent Vitals/Pain Pain Assessment Pain Assessment: Faces Pain Location: L leg Pain Descriptors / Indicators: Cramping Pain Intervention(s): Limited activity within patient's tolerance, Monitored during session, Repositioned    Home Living Family/patient expects to be discharged to:: Private residence Living Arrangements: Other relatives Available Help at Discharge: Family;Available PRN/intermittently Type of Home: House Home Access: Stairs to enter Entrance Stairs-Rails: Can reach both;Right;Left Entrance Stairs-Number of Steps: 10   Home Layout: One level Home Equipment: Gilmer Mor - single point;BSC/3in1;Wheelchair - Forensic psychologist (2 wheels);Shower seat      Prior Function Prior Level of Function : Independent/Modified Independent             Mobility Comments: Community ambulator without AD; works. ADLs Comments: Independent     Hand Dominance        Extremity/Trunk Assessment   Upper Extremity Assessment Upper Extremity Assessment: Defer to OT evaluation    Lower Extremity Assessment Lower Extremity Assessment: LLE deficits/detail LLE Deficits / Details: grossly 4/5 LLE Sensation: WNL LLE Coordination: WNL    Cervical / Trunk Assessment Cervical / Trunk Assessment: Normal  Communication   Communication: No difficulties  Cognition Arousal/Alertness: Awake/alert Behavior During Therapy: WFL for tasks assessed/performed Overall Cognitive Status: Within Functional Limits for tasks assessed                                          General Comments      Exercises     Assessment/Plan    PT Assessment All further PT needs can be met in the next venue of care  PT Problem List Decreased strength;Decreased activity tolerance;Decreased balance;Decreased mobility       PT Treatment Interventions      PT Goals (Current goals can  be found in the Care Plan section)  Acute Rehab PT Goals Patient Stated Goal: return home with family to assist PT Goal Formulation: With patient Time For Goal Achievement: 02/05/23 Potential to Achieve Goals: Good    Frequency       Co-evaluation PT/OT/SLP Co-Evaluation/Treatment: Yes Reason for Co-Treatment: To address functional/ADL transfers PT goals addressed during session: Mobility/safety with mobility;Balance         AM-PAC PT "6 Clicks" Mobility  Outcome Measure Help needed turning from your back to your side while in a flat bed without using bedrails?: None Help needed moving from lying on your back to sitting on the side of a flat bed without using bedrails?: None Help needed moving to and from a bed to a chair (including a wheelchair)?: None Help needed standing up from a chair using your arms (e.g., wheelchair or bedside chair)?: None Help needed to walk in hospital room?: A Little Help needed climbing 3-5 steps with a railing? : A  Little 6 Click Score: 22    End of Session   Activity Tolerance: Patient tolerated treatment well;Patient limited by fatigue Patient left: in chair;with call bell/phone within reach Nurse Communication: Mobility status PT Visit Diagnosis: Unsteadiness on feet (R26.81);Other abnormalities of gait and mobility (R26.89);Muscle weakness (generalized) (M62.81)    Time: 1610-9604 PT Time Calculation (min) (ACUTE ONLY): 20 min   Charges:   PT Evaluation $PT Eval Moderate Complexity: 1 Mod PT Treatments $Therapeutic Activity: 8-22 mins        1:57 PM, 02/05/23 Ocie Bob, MPT Physical Therapist with [facility] [phone] office [phone] mobile phone

## 2023-02-05 NOTE — Evaluation (Signed)
Occupational Therapy Evaluation Patient Details Name: Robin Chandler MRN: [id number] DOB: June 02, 1961 Today's Date: 02/05/2023   History of Present Illness Robin Chandler is a 62 y.o. female with medical history significant of diabetes mellitus type 2, hypertension, no other known medical history, presents to ED with a chief complaint of left-sided weakness.  Patient reports left-sided weakness started on the same day as presentation.  It was gradual in onset per her report but it sounds like it had already started when she woke up in the morning.  She had some dizziness in the morning and took time to regroup.  At 1:00 she was talking to her daughter on the phone when her daughter said that she had had slurred speech.  She reports when she tried to ambulate she was dragging her left foot but able to get around the house.  Was the symptoms that brought them in the ED.  Patient does not explain that yesterday she was not feeling quite herself.  She was overly fatigued, and had generalized weakness.  She did not think the weakness was asymmetric yesterday.  Family had traveled a lot the day before and she thought that she was just tired and needed rest.  Patient reports associated symptoms today included blurry vision that went away around 4 PM, and trouble with word finding.  She did not have any trouble with swallowing did not have any drooling.  She denies any numbness.  She denies tinnitus.  She reports she has never had symptoms like these before.  Patient is very concerned about how she could possibly have old strokes on her CT scan when she has never had symptoms of stroke.  We talked about this extensively. (per DO)   Clinical Impression   Pt agreeable to OT and PT co-evaluation. Pt independent and working at baseline. Pt demonstrated weakness in L UE as well as decreased coordination. Pt was mildly unsteady in standing and ambulation. Functionally pt can complete ADL's independently, but weakness is  present. Pt will benefit from continued OT in the hospital and recommended venue below to increase strength, balance, and endurance for safe ADL's.        Recommendations for follow up therapy are one component of a multi-disciplinary discharge planning process, led by the attending physician.  Recommendations may be updated based on patient status, additional functional criteria and insurance authorization.   Assistance Recommended at Discharge PRN        Functional Status Assessment  Patient has had a recent decline in their functional status and demonstrates the ability to make significant improvements in function in a reasonable and predictable amount of time.  Equipment Recommendations  None recommended by OT    Recommendations for Other Services       Precautions / Restrictions Precautions Precautions: Fall Restrictions Weight Bearing Restrictions: No      Mobility Bed Mobility Overal bed mobility: Independent                  Transfers Overall transfer level: Modified independent Equipment used: None               General transfer comment: Mild labored movement.      Balance Overall balance assessment: Mild deficits observed, not formally tested                                         ADL either performed  or assessed with clinical judgement   ADL Overall ADL's : Modified independent                                       General ADL Comments: Mild extended time. Able to doff and don socks without assist at EOB.     Vision Baseline Vision/History: 1 Wears glasses Ability to See in Adequate Light: 0 Adequate Patient Visual Report: No change from baseline Vision Assessment?: No apparent visual deficits                Pertinent Vitals/Pain Pain Assessment Pain Assessment: Faces Faces Pain Scale: Hurts a little bit Pain Location: L leg Pain Descriptors / Indicators: Cramping Pain Intervention(s):  Limited activity within patient's tolerance, Monitored during session, Repositioned     Hand Dominance     Extremity/Trunk Assessment Upper Extremity Assessment Upper Extremity Assessment: LUE deficits/detail LUE Deficits / Details: 4/5 shoulder flexion, abduction. 4-/5 wrist flexion and extension. 3+/5 grip strength. LUE Sensation: WNL LUE Coordination: decreased gross motor   Lower Extremity Assessment Lower Extremity Assessment: Defer to PT evaluation   Cervical / Trunk Assessment Cervical / Trunk Assessment: Normal   Communication Communication Communication: No difficulties   Cognition Arousal/Alertness: Awake/alert Behavior During Therapy: WFL for tasks assessed/performed Overall Cognitive Status: Within Functional Limits for tasks assessed                                                        Home Living Family/patient expects to be discharged to:: Private residence Living Arrangements: Other relatives Available Help at Discharge: Family;Available PRN/intermittently Type of Home: House Home Access: Stairs to enter [employer] of Steps: 10 Entrance Stairs-Rails: Can reach both;Right;Left Home Layout: One level     Bathroom Shower/Tub: Chief Strategy Officer: Standard Bathroom Accessibility: No   Home Equipment: Gilmer Mor - single point;BSC/3in1;Wheelchair - Forensic psychologist (2 wheels)          Prior Functioning/Environment Prior Level of Function : Independent/Modified Independent             Mobility Comments: Community ambulator without AD; works. ADLs Comments: Independent        OT Problem List: Decreased strength;Decreased activity tolerance;Impaired balance (sitting and/or standing);Decreased coordination      OT Treatment/Interventions: Self-care/ADL training;Therapeutic exercise;Neuromuscular education;Therapeutic activities;Balance training;Patient/family education    OT Goals(Current goals  can be found in the care plan section) Acute Rehab OT Goals Patient Stated Goal: return to work OT Goal Formulation: With patient Time For Goal Achievement: 02/19/23 Potential to Achieve Goals: Good  OT Frequency: Min 1X/week    Co-evaluation PT/OT/SLP Co-Evaluation/Treatment: Yes Reason for Co-Treatment: To address functional/ADL transfers   OT goals addressed during session: ADL's and self-care      AM-PAC OT "6 Clicks" Daily Activity     Outcome Measure Help from another person eating meals?: None Help from another person taking care of personal grooming?: None Help from another person toileting, which includes using toliet, bedpan, or urinal?: None Help from another person bathing (including washing, rinsing, drying)?: None Help from another person to put on and taking off regular upper body clothing?: None Help from another person to put on and taking off regular lower body clothing?: None 6  Click Score: 24   End of Session    Activity Tolerance: Patient tolerated treatment well Patient left: Other (comment) (In stransport chair with trasport staff.)  OT Visit Diagnosis: Unsteadiness on feet (R26.81);Muscle weakness (generalized) (M62.81);Hemiplegia and hemiparesis;Other symptoms and signs involving the nervous system (R29.898) Hemiplegia - Right/Left: Left Hemiplegia - caused by:  (possible CVA)                Time: 8469-6295 OT Time Calculation (min): 16 min Charges:  OT General Charges $OT Visit: 1 Visit OT Evaluation $OT Eval Low Complexity: 1 Low  Rowyn Spilde OT, MOT  Danie Chandler 02/05/2023, 9:09 AM

## 2023-02-05 NOTE — Assessment & Plan Note (Addendum)
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  Patient was already taking 81 mg aspirin daily, neuro recommended to add Plavix daily as well - Continue Crestor and continue blood pressure control

## 2023-02-05 NOTE — Plan of Care (Signed)
  Problem: Acute Rehab OT Goals (only OT should resolve) Goal: Pt/Caregiver Will Perform Home Exercise Program Flowsheets (Taken 02/05/2023 [id number]) Pt/caregiver will Perform Home Exercise Program:  Increased strength  Independently  Left upper extremity  Robin Chandler OT, MOT

## 2023-02-05 NOTE — Assessment & Plan Note (Signed)
-   Patient takes 20 units of basal insulin at baseline - Reducing to 10 units of basal insulin while in the hospital - Sliding scale coverage

## 2023-02-05 NOTE — Assessment & Plan Note (Signed)
Continue ACE.  

## 2023-02-05 NOTE — Discharge Instructions (Signed)
Clent Demark,  You were in the hospital with a new stroke. You will be started on a new medication called Plavix. Please continue Aspirin for 21 days. Please follow-up with outpatient neurology. You also need a heart monitor to make sure there is no atrial fibrillation.

## 2023-02-05 NOTE — H&P (Signed)
History and Physical    Patient: Robin Chandler ZOX:[id number] DOB: [birth year] DOA: 02/04/2023 DOS: the patient was seen and examined on 02/05/2023 PCP: [facility]  Patient coming from: Home  Chief Complaint:  Chief Complaint  Patient presents with   Visual Field Change   Weakness   Aphasia   HPI: Robin Chandler is a 62 y.o. female with medical history significant of diabetes mellitus type 2, hypertension, no other known medical history, presents to ED with a chief complaint of left-sided weakness.  Patient reports left-sided weakness started on the same day as presentation.  It was gradual in onset per her report but it sounds like it had already started when she woke up in the morning.  She had some dizziness in the morning and took time to regroup.  At 1:00 she was talking to her daughter on the phone when her daughter said that she had had slurred speech.  She reports when she tried to ambulate she was dragging her left foot but able to get around the house.  Was the symptoms that brought them in the ED.  Patient does not explain that yesterday she was not feeling quite herself.  She was overly fatigued, and had generalized weakness.  She did not think the weakness was asymmetric yesterday.  Family had traveled a lot the day before and she thought that she was just tired and needed rest.  Patient reports associated symptoms today included blurry vision that went away around 4 PM, and trouble with word finding.  She did not have any trouble with swallowing did not have any drooling.  She denies any numbness.  She denies tinnitus.  She reports she has never had symptoms like these before.  Patient is very concerned about how she could possibly have old strokes on her CT scan when she has never had symptoms of stroke.  We talked about this extensively.  Patient currently smokes half a pack per day.  She declines nicotine patch at this time saying that she can quit whenever she  wants.  We talked about reasons why it might be time to quit now.  Patient does not sound convinced at this time.  She does not drink alcohol does not use illicit drugs.  She is vaccinated for COVID.  Patient reports that she would want to be DNR. Review of Systems: As mentioned in the history of present illness. All other systems reviewed and are negative. Past Medical History:  Diagnosis Date   Diabetes mellitus without complication (HCC)    Hypertension    Past Surgical History:  Procedure Laterality Date   CHOLECYSTECTOMY     Social History:  reports that she has been smoking. She has never used smokeless tobacco. She reports that she does not drink alcohol and does not use drugs.  No Known Allergies  History reviewed. No pertinent family history.  Prior to Admission medications   Medication Sig Start Date End Date Taking? Authorizing Provider  aspirin EC 81 MG tablet Take 81 mg by mouth daily.    [provider]  busPIRone (BUSPAR) 7.5 MG tablet Take 7.5 mg by mouth daily. 06/25/19   [provider]  cyclobenzaprine (FLEXERIL) 5 MG tablet Take 5 mg by mouth 3 (three) times daily as needed for muscle spasms.  06/10/19   [provider]  glimepiride (AMARYL) 4 MG tablet Take 4 mg by mouth 2 (two) times daily. 05/25/19   [provider]  hydrOXYzine (ATARAX/VISTARIL) 25 MG tablet  Take 25-50 mg by mouth every 6 (six) hours as needed. For anxiety 05/30/19   [provider]  Insulin Glargine (BASAGLAR KWIKPEN) 100 UNIT/ML SOPN Inject 20 Units into the skin at bedtime.  06/14/19   [provider]  JANUMET 50-1000 MG tablet Take 1 tablet by mouth 2 (two) times daily. 06/24/19   [provider]  lisinopril (ZESTRIL) 20 MG tablet Take 20 mg by mouth daily. 06/13/19   [provider]  omeprazole (PRILOSEC) 40 MG capsule Take 40 mg by mouth daily. 06/02/19   [provider]  rosuvastatin (CRESTOR) 20 MG tablet Take 20 mg by  mouth daily. 04/19/19   [provider]  vitamin C (ASCORBIC ACID) 500 MG tablet Take 500 mg by mouth daily.    [provider]  zolpidem (AMBIEN) 10 MG tablet Take 10 mg by mouth at bedtime. 06/02/19   [provider]    Physical Exam: Vitals:   02/04/23 2100 02/04/23 2217 02/04/23 2242 02/04/23 2300  BP: 133/60 (!) 154/83 (!) 141/63   Pulse: 65 68    Resp: 20 19 13    Temp:  98.8 F (37.1 C) 98 F (36.7 C)   TempSrc:  Oral Oral   SpO2: 96% 98% 98%   Weight:    70.5 kg  Height:    5\' 3"  (1.6 m)   1.  General: Patient lying supine in bed,  no acute distress   2. Psychiatric: Alert and oriented x 3, mood and behavior normal for situation, pleasant and cooperative with exam   3. Neurologic: Speech and language are normal, face is symmetric, moves all 4 extremities voluntarily and with good range of motion, but quickly reaches muscle fatigue when holding her left leg, cannot resist against more than gravity, left strength is reduced too,  4. HEENMT:  Head is atraumatic, normocephalic, pupils reactive to light, neck is supple, trachea is midline, mucous membranes are moist   5. Respiratory : Lungs are clear to auscultation bilaterally without wheezing, rhonchi, rales, no cyanosis, no increase in work of breathing or accessory muscle use   6. Cardiovascular : Heart rate normal, rhythm is regular, no murmurs, rubs or gallops, no peripheral edema, peripheral pulses palpated   7. Gastrointestinal:  Abdomen is soft, nondistended, nontender to palpation bowel sounds active, no masses or organomegaly palpated   8. Skin:  Skin is warm, dry and intact without rashes, acute lesions, or ulcers on limited exam   9.Musculoskeletal:  No acute deformities or trauma, no asymmetry in tone, no peripheral edema, peripheral pulses palpated, no tenderness to palpation in the extremities  Data Reviewed: In the ED Temp 98.2, heart rate 66/83, respiratory rate 15-22, blood  pressure 112/58-129/66, satting at 98% No leukocytosis with white blood cell count of 8.7 Chemistry reveals an elevated creatinine at 1.60 but the most recent creatinine we have is 62 years old Glucose is elevated 226 CTA head shows no large vessel occlusion but does show moderate to severe bilateral stenosis of the ICAs and mild stenosis on the left V1 segment CT head is without acute changes but shows small lacunar infarct in the right cerebellar hemisphere that is nonacute and nonacute lacunar infarct in the pons Neurology was consulted and recommended admission to [facility] for stroke workup and to start on dual antiplatelet therapy  Assessment and Plan: * CVA (cerebral vascular accident) [place] [facility]) - Patient presents with left upper and lower extremity weakness - CTA shows no large vessel occlusion - CT head  shows small lacunar infarct in right cerebellar hemisphere that is nonacute and a nonacute lacunar infarct in pons with some microvascular ischemia - There is some moderate to severe stenosis in her bilateral ICA - She was out of the tPA window at arrival - Neurology was consulted and recommended admission here for MRI tomorrow and stroke workup.  Patient was already taking 81 mg aspirin daily, neuro recommended to add Plavix daily as well - Continue Crestor and continue blood pressure control  Tobacco use disorder - Advised on importance of cessation - Patient declines nicotine patch at this time  HTN (hypertension) - Continue ACE  DMII (diabetes mellitus, type 2) (HCC) - Patient takes 20 units of basal insulin at baseline - Reducing to 10 units of basal insulin while in the hospital - Sliding scale coverage  Elevated serum creatinine - Creatinine is up to 1.60 - The most recent creatinine we have to compare with is 62 years old at 20.99 - It is likely that her new baseline is somewhere in between these - Encourage patient to take p.o. intake - Trend in the a.m. - Avoid  nephrotoxic agents when possible      Advance Care Planning:   Code Status: DNR  Consults: Neurology  Family Communication: Daughter at bedside  Severity of Illness: The appropriate patient status for this patient is OBSERVATION. Observation status is judged to be reasonable and necessary in order to provide the required intensity of service to ensure the patient's safety. The patient's presenting symptoms, physical exam findings, and initial radiographic and laboratory data in the context of their medical condition is felt to place them at decreased risk for further clinical deterioration. Furthermore, it is anticipated that the patient will be medically stable for discharge from the hospital within 2 midnights of admission.   Author: Lilyan Gilford, DO 02/05/2023 2:22 AM  For on call review [email or web address].

## 2023-02-05 NOTE — Assessment & Plan Note (Signed)
-   Advised on importance of cessation - Patient declines nicotine patch at this time

## 2023-02-05 NOTE — Consult Note (Signed)
I connected with  Robin Chandler on 02/05/23 by a video enabled telemedicine application and verified that I am speaking with the correct person using two identifiers.   I discussed the limitations of evaluation and management by telemedicine. The patient expressed understanding and agreed to proceed.  Location of patient Location of physician: [facility]   Neurology Consultation Reason for Consult: stroke Referring Physician: Dr Victorino Dike  CC: stroke  History is obtained from: patient, chart review  HPI: Robin Chandler is a 62 y.o. female with PMH of HTN, DM, HLD, nicotine use disorder who presented with left sided weakness. States she was LKN on 02/03/2023 at around 0600 when she went to bed. She woke up around noon and noticed her left side was weak. Her symptoms persisted on Sunday and family noted facial droop as well as slurred speech so brought her to [facility]. Denies prior history of seizures. Takes ASA 81mg  daily.  ROS: All other systems reviewed and negative except as noted in the HPI.   Past Medical History:  Diagnosis Date   Diabetes mellitus without complication (HCC)    Hypertension     History reviewed. No pertinent family history.  Social History:  reports that she has been smoking. She has never used smokeless tobacco. She reports that she does not drink alcohol and does not use drugs.   Medications Prior to Admission  Medication Sig Dispense Refill Last Dose   aspirin EC 81 MG tablet Take 81 mg by mouth daily.   02/04/2023   atorvastatin (LIPITOR) 40 MG tablet Take 40 mg by mouth daily.      busPIRone (BUSPAR) 7.5 MG tablet Take 7.5 mg by mouth daily.   02/04/2023   cyclobenzaprine (FLEXERIL) 10 MG tablet Take 10 mg by mouth at bedtime.   Past Week   hydrOXYzine (ATARAX/VISTARIL) 25 MG tablet Take 25-50 mg by mouth every 6 (six) hours as needed. For anxiety   unk   Insulin Glargine (BASAGLAR KWIKPEN) 100 UNIT/ML SOPN Inject 20 Units into the skin at  bedtime.    Past Week   JANUMET 50-1000 MG tablet Take 1 tablet by mouth 2 (two) times daily.   02/04/2023   JARDIANCE 25 MG TABS tablet Take 25 mg by mouth daily.      lisinopril (ZESTRIL) 20 MG tablet Take 20 mg by mouth daily.   02/04/2023   omeprazole (PRILOSEC) 40 MG capsule Take 40 mg by mouth daily.   02/04/2023   traZODone (DESYREL) 50 MG tablet Take 50 mg by mouth at bedtime.   Past Week   vitamin C (ASCORBIC ACID) 500 MG tablet Take 500 mg by mouth daily.   02/04/2023   zolpidem (AMBIEN) 10 MG tablet Take 10 mg by mouth at bedtime. (Patient not taking: Reported on 02/05/2023)   Not Taking      Exam: Current vital signs: BP 126/60 (BP Location: Left Arm)   Pulse 76   Temp 98.2 F (36.8 C) (Oral)   Resp 18   Ht 5\' 3"  (1.6 m)   Wt 70.5 kg   SpO2 100%   BMI 27.53 kg/m  Vital signs in last 24 hours: Temp:  [98 F (36.7 C)-98.8 F (37.1 C)] 98.2 F (36.8 C) (04/29 0643) Pulse Rate:  [62-83] 76 (04/29 0643) Resp:  [13-22] 18 (04/29 0643) BP: (112-157)/(55-83) 126/60 (04/29 0643) SpO2:  [94 %-100 %] 100 % (04/29 0643) Weight:  [70.5 kg-76.2 kg] 70.5 kg (04/28 2300)   Physical Exam  Constitutional: Appears well-developed and well-nourished.  Psych: Affect appropriate to situation Neuro: Aox3, CN 2-12 grossly intact except left facial droop, antigravity strength in all extremity without drift, sensory intact to light touch, FTN intact  NIHSS 1 ( facial droop)  I have reviewed labs in epic and the results pertinent to this consultation are: CBC:  Recent Labs  Lab 02/04/23 1529 02/05/23 0442  WBC 8.7 9.1  NEUTROABS 4.6  --   HGB 12.4 12.2  HCT 38.9 37.8  MCV 88.6 88.3  PLT 258 257    Basic Metabolic Panel:  Lab Results  Component Value Date   NA 135 02/05/2023   K 4.2 02/05/2023   CO2 20 (L) 02/05/2023   GLUCOSE 189 (H) 02/05/2023   BUN 38 (H) 02/05/2023   CREATININE 1.25 (H) 02/05/2023   CALCIUM 9.2 02/05/2023   GFRNONAA 49 (L) 02/05/2023   GFRAA >60  07/08/2019   Lipid Panel:  Lab Results  Component Value Date   LDLCALC UNABLE TO CALCULATE IF TRIGLYCERIDE OVER 400 mg/dL 16/07/9603   [id number]:  Lab Results  Component Value Date   HGBA1C 9.1 (H) 02/05/2023   Urine Drug Screen: No results found for: "LABOPIA", "COCAINSCRNUR", "LABBENZ", "AMPHETMU", "THCU", "LABBARB"  Alcohol Level No results found for: "ETH"   I have reviewed the images obtained:  CT Head without contrast 02/04/2023: No acute intracranial abnormalities. 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 Suspected nonacute lacunar infarct in the pons. White matter changes consistent with chronic microvascular ischemia. Calcified atherosclerotic changes in the intracranial carotids. Mucosal thickening in the right maxillary sinus.  CT angio Head and Neck with contrast 02/04/2023:  No intracranial large vessel occlusion. 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 Mild stenosis in the left V1 segment. No other hemodynamically significant stenosis in the neck. Aortic atherosclerosis.  MRI Brain 02/05/2023: Acute infarction affecting the right para median pons. No hemorrhage or mass effect. Chronic small-vessel ischemic changes elsewhere throughout the brain as outlined above. Acute right maxillary sinusitis.     ASSESSMENT/PLAN: 62yo F with acute onset left sided weakness and facial droop.   Acute ischemic stroke Chronic strokes HTN HLD DM Nicotine use order - etiology: likely due to small vessel disease, less likely embolic  Recommendations: - recommend asa 81mg  daily and plavix 75mg  daily followed by plavix 75mg  daily ( was on asa already( - recommend atorva 40mg  daily. Patient states she just started it 1 month ago so didn't want to increase to 80mg   - 30 day event monitor to look for paroxysmal a fib - PT/OT - goal BP: normotension - stroke educaiton  including BEFAST - F/u with Dr Pearlean Brownie at [facility] neuro in 3 months   Thank you for allowing [place] to participate in the care of this patient. If you have any further questions, please contact  me or neurohospitalist.   Lindie Spruce [facility] neurohospitalist

## 2023-02-05 NOTE — Progress Notes (Signed)
Request relayed by Dr. Wyline Mood - pt needs 2 week non live Zio per IM request for stroke. Spoke with pt by phone so she knows to expect this. Outlined on AVS as well.

## 2023-02-05 NOTE — Hospital Course (Signed)
Robin Chandler is a 62 y.o. female with a history of diabetes mellitus type 2 and hypertension. Patient presented secondary to left sided weakness and dizziness with slurred speech. Workup significant for evidence of an acute infarct involving the right para median pons. Neurology consulted and are recommending dual antiplatelet therapy. PT/OT on discharge. Event monitor arranged with cardiology.

## 2023-02-05 NOTE — Discharge Summary (Signed)
Physician Discharge Summary   Patient: Robin Chandler MRN: [id number] DOB: 12-02-1960  Admit date:     02/04/2023  Discharge date: 02/05/23  Discharge Physician: Jacquelin Hawking, MD   PCP: Center, [facility]   Recommendations at discharge:  PCP follow-up Neurology follow-up Repeat fasting lipid panel to follow-up hypertriglyceridemia Tighter diabetes control Event monitor per cardiology  Discharge Diagnoses: Principal Problem:   CVA (cerebral vascular accident) [facility]) Active Problems:   Elevated serum creatinine   DMII (diabetes mellitus, type 2) (HCC)   HTN (hypertension)   Tobacco use disorder  Resolved Problems:   * No resolved hospital problems. *  Hospital Course: Robin Chandler is a 62 y.o. female with a history of diabetes mellitus type 2 and hypertension. Patient presented secondary to left sided weakness and dizziness with slurred speech. Workup significant for evidence of an acute infarct involving the right para median pons. Neurology consulted and are recommending dual antiplatelet therapy. PT/OT on discharge. Event monitor arranged with cardiology.  Assessment and Plan:  Acute stroke Initial CT head concerning for a non-acute stroke involving right cerebellar hemisphere. MRI confirms acute infarction involving the right para median pons. CTA head and neck without intracranial large vessel occlusion. LDL of 80. Hemoglobin A1C of 9.1%. Transthoracic Echocardiogram significant for normal LVEF with grade 1 diastolic and no intraatrial shunt. Neurology recommendations for Aspirin for 21 days in addition to Plavix indefinitely. PT/OT recommendations for outpatient PT/OT. Patient discharged on Aspirin and Plavix. She is to continue Lipitor and is being set up for a 14-day event monitor to rule out atrial fibrillation. Recommendation for Neurology follow-up in 6 weeks.   Primary hypertension Patient is on lisinopril as an outpatient which was continued on admission.  Resume lisinopril on discharge.   Diabetes mellitus type 2 Patient is on Janumet, Designer, multimedia as an outpatient. Diabetes is uncontrolled with hyperglycemia. Hemoglobin A1C is 9.1%. Patient will need continued optimization of hyperglycemia especially secondary to acute stroke. Recommend close follow-up with PCP.   AKI Unknown baseline. Creatinine of 1.6 on admission, which has improved to 1.25. Patient will need outpatient metabolic panel monitoring to assess for possible CKD. PCP follow-up.   Hyperlipidemia Patient is managed on Lipitor 40 mg as an outpatient. Started on Crestor inpatient. Can switch back to home Lipitor on discharge.   Hypertriglyceridemia Unclear if this is after true fast or not. Mildly elevated. Outpatient follow-up.   Tobacco use Cessation discussed on admission   Consultants: Neurology Procedures performed:  4/29: Transthoracic Echocardiogram   Disposition: Home Diet recommendation: Carb modified diet   DISCHARGE MEDICATION: Allergies as of 02/05/2023   No Known Allergies      Medication List     STOP taking these medications    omeprazole 40 MG capsule Commonly known as: PRILOSEC   zolpidem 10 MG tablet Commonly known as: AMBIEN       TAKE these medications    ascorbic acid 500 MG tablet Commonly known as: VITAMIN C Take 500 mg by mouth daily.   aspirin EC 81 MG tablet Take 1 tablet (81 mg total) by mouth daily for 21 days.   atorvastatin 40 MG tablet Commonly known as: LIPITOR Take 40 mg by mouth daily.   Basaglar KwikPen 100 UNIT/ML Inject 20 Units into the skin at bedtime.   busPIRone 7.5 MG tablet Commonly known as: BUSPAR Take 7.5 mg by mouth daily.   clopidogrel 75 MG tablet Commonly known as: PLAVIX Take 1 tablet (75 mg total) by mouth daily.  Start taking on: February 06, 2023   cyclobenzaprine 10 MG tablet Commonly known as: FLEXERIL Take 10 mg by mouth at bedtime.   hydrOXYzine 25 MG tablet Commonly  known as: ATARAX Take 25-50 mg by mouth every 6 (six) hours as needed. For anxiety   Janumet 50-1000 MG tablet Generic drug: sitaGLIPtin-metformin Take 1 tablet by mouth 2 (two) times daily.   Jardiance 25 MG Tabs tablet Generic drug: empagliflozin Take 25 mg by mouth daily.   lisinopril 20 MG tablet Commonly known as: ZESTRIL Take 20 mg by mouth daily.   pantoprazole 40 MG tablet Commonly known as: Protonix Take 1 tablet (40 mg total) by mouth daily.   traZODone 50 MG tablet Commonly known as: DESYREL Take 50 mg by mouth at bedtime.        Follow-up Information     Center, [facility]. Schedule an appointment as soon as possible for a visit in 1 week(s).   Why: For hospital follow-up Contact information: [address] [place] [address] [phone]         Micki Riley, MD Follow up in 6 week(s).   Specialties: Neurology, Radiology Why: For hospital follow-up for stroke Contact information: [address] [place] [address] [phone]         [facility] Follow up.   Specialty: Cardiology Why: [facility] [place] office will be mailing you a heart monitor to wear for 14 days. It will come with instructions for how to use but there will be a number to call if you have any questions. You can also also call the HeartCare team if you run into problems. Contact information: [address] [id number] mc [place] [address] [phone]               Discharge Exam: BP 126/60 (BP Location: Left Arm)   Pulse 76   Temp 98.2 F (36.8 C) (Oral)   Resp 18   Ht 5\' 3"  (1.6 m)   Wt 70.5 kg   SpO2 100%   BMI 27.53 kg/m   General exam: Appears calm and comfortable Respiratory system: Clear to auscultation. Respiratory effort normal. Cardiovascular system: S1 & S2 heard, RRR. No murmurs, rubs, gallops or clicks. Gastrointestinal system: Abdomen is nondistended, soft and  nontender. No organomegaly or masses felt. Normal bowel sounds heard. Central nervous system: Alert and oriented. Left sided upper/lower extremity weakness of 4/5 strength compared to 5/5 strength of right side Musculoskeletal: No edema. No calf tenderness Skin: No cyanosis. No rashes Psychiatry: Judgement and insight appear normal. Mood & affect appropriate.  Condition at discharge: stable  The results of significant diagnostics from this hospitalization (including imaging, microbiology, ancillary and laboratory) are listed below for reference.   Imaging Studies: ECHOCARDIOGRAM COMPLETE  Result Date: 02/05/2023    ECHOCARDIOGRAM REPORT   Patient Name:   JENEA DAKE Date of Exam: 02/05/2023 Medical Rec #:  [id number]    Height:       63.0 in Accession #:    [id number]   Weight:       155.4 lb Date of Birth:  May 15, 1961     BSA:          1.737 m Patient Age:    61 years     BP:           126/60 mmHg Patient Gender: F            HR:  76 bpm. Exam Location:  [facility] Procedure: 2D Echo, Cardiac Doppler and Color Doppler Indications:    Stroke I63.9  History:        Patient has no prior history of Echocardiogram examinations.                 Stroke; Risk Factors:Hypertension, Diabetes and Current Smoker.  Sonographer:    Celesta Gentile RCS Referring Phys: [id number] ASIA B ZIERLE-GHOSH IMPRESSIONS  1. Left ventricular ejection fraction, by estimation, is 60 to 65%. The left ventricle has normal function. The left ventricle has no regional wall motion abnormalities. There is mild left ventricular hypertrophy. Left ventricular diastolic parameters are consistent with Grade I diastolic dysfunction (impaired relaxation). Elevated left atrial pressure.  2. Right ventricular systolic function is normal. The right ventricular size is normal.  3. Left atrial size was mildly dilated.  4. The mitral valve is normal in structure. Trivial mitral valve regurgitation. No evidence of mitral stenosis.  5. The aortic  valve is tricuspid. There is mild calcification of the aortic valve. There is mild thickening of the aortic valve. Aortic valve regurgitation is mild. Mild to moderate aortic valve stenosis. FINDINGS  Left Ventricle: Left ventricular ejection fraction, by estimation, is 60 to 65%. The left ventricle has normal function. The left ventricle has no regional wall motion abnormalities. The left ventricular internal cavity size was normal in size. There is  mild left ventricular hypertrophy. Left ventricular diastolic parameters are consistent with Grade I diastolic dysfunction (impaired relaxation). Elevated left atrial pressure. Right Ventricle: The right ventricular size is normal. Right vetricular wall thickness was not well visualized. Right ventricular systolic function is normal. Left Atrium: Left atrial size was mildly dilated. Right Atrium: Right atrial size was normal in size. Pericardium: There is no evidence of pericardial effusion. Mitral Valve: The mitral valve is normal in structure. Trivial mitral valve regurgitation. No evidence of mitral valve stenosis. Tricuspid Valve: The tricuspid valve is normal in structure. Tricuspid valve regurgitation is not demonstrated. No evidence of tricuspid stenosis. Aortic Valve: The aortic valve is tricuspid. There is mild calcification of the aortic valve. There is mild thickening of the aortic valve. There is mild aortic valve annular calcification. Aortic valve regurgitation is mild. Aortic regurgitation PHT measures 437 msec. Mild to moderate aortic stenosis is present. Aortic valve mean gradient measures 15.0 mmHg. Aortic valve peak gradient measures 26.1 mmHg. Aortic valve area, by VTI measures 1.33 cm. Pulmonic Valve: The pulmonic valve was not well visualized. Pulmonic valve regurgitation is not visualized. No evidence of pulmonic stenosis. Aorta: The aortic root is normal in size and structure. IAS/Shunts: No atrial level shunt detected by color flow Doppler.   LEFT VENTRICLE PLAX 2D LVIDd:         4.50 cm   Diastology LVIDs:         3.20 cm   LV e' medial:    4.24 cm/s LV PW:         1.00 cm   LV E/e' medial:  18.6 LV IVS:        1.20 cm   LV e' lateral:   6.96 cm/s LVOT diam:     2.00 cm   LV E/e' lateral: 11.3 LV SV:         76 LV SV Index:   44 LVOT Area:     3.14 cm  RIGHT VENTRICLE RV S prime:     11.10 cm/s TAPSE (M-mode): 2.0 cm LEFT ATRIUM  Index        RIGHT ATRIUM           Index LA diam:        3.30 cm 1.90 cm/m   RA Area:     15.50 cm LA Vol (A2C):   69.0 ml 39.72 ml/m  RA Volume:   35.70 ml  20.55 ml/m LA Vol (A4C):   51.7 ml 29.76 ml/m LA Biplane Vol: 61.6 ml 35.46 ml/m  AORTIC VALVE AV Area (Vmax):    1.22 cm AV Area (Vmean):   1.26 cm AV Area (VTI):     1.33 cm AV Vmax:           255.50 cm/s AV Vmean:          180.500 cm/s AV VTI:            0.573 m AV Peak Grad:      26.1 mmHg AV Mean Grad:      15.0 mmHg LVOT Vmax:         99.20 cm/s LVOT Vmean:        72.400 cm/s LVOT VTI:          0.242 m LVOT/AV VTI ratio: 0.42 AI PHT:            437 msec  AORTA Ao Root diam: 3.40 cm MITRAL VALVE MV Area (PHT): 2.91 cm     SHUNTS MV Decel Time: 261 msec     Systemic VTI:  0.24 m MV E velocity: 78.80 cm/s   Systemic Diam: 2.00 cm MV A velocity: 117.00 cm/s MV E/A ratio:  0.67 Dina Rich MD Electronically signed by Dina Rich MD Signature Date/Time: 02/05/2023/1:15:43 PM    Final    MR BRAIN WO CONTRAST  Result Date: 02/05/2023 CLINICAL DATA:  Neuro deficit, acute, stroke suspected. Weakness and aphasia upon presentation yesterday. EXAM: MRI HEAD WITHOUT CONTRAST TECHNIQUE: Multiplanar, multiecho pulse sequences of the brain and surrounding structures were obtained without intravenous contrast. COMPARISON:  CT studies 02/04/2023 FINDINGS: Brain: Diffusion imaging shows acute infarction affecting the right para median pons. No other acute insult. There is an old small vessel infarction in the left side of the pons. There are old small  vessel cerebellar infarctions. Cerebral hemispheres show mild chronic small-vessel ischemic changes of the white matter. Old lacunar infarction left caudate head. No large vessel territory stroke. No mass, hemorrhage, hydrocephalus or extra-axial collection. Vascular: Major vessels at the base of the brain show flow. Skull and upper cervical spine: Negative Sinuses/Orbits: Acute inflammatory changes affecting the right maxillary sinus. Orbits negative. Other: None IMPRESSION: 1. Acute infarction affecting the right para median pons. No hemorrhage or mass effect. 2. Chronic small-vessel ischemic changes elsewhere throughout the brain as outlined above. 3. Acute right maxillary sinusitis. Electronically Signed   By: Paulina Fusi M.D.   On: 02/05/2023 09:18   CT ANGIO HEAD NECK W WO CM  Result Date: 02/04/2023 CLINICAL DATA:  Visual field changes, weakness, aphasia, slurred speech EXAM: CT ANGIOGRAPHY HEAD AND NECK WITH AND WITHOUT CONTRAST TECHNIQUE: Multidetector CT imaging of the head and neck was performed using the standard protocol during bolus administration of intravenous contrast. Multiplanar CT image reconstructions and MIPs were obtained to evaluate the vascular anatomy. Carotid stenosis measurements (when applicable) are obtained utilizing NASCET criteria, using the distal internal carotid diameter as the denominator. RADIATION DOSE REDUCTION: This exam was performed according to the departmental dose-optimization program which includes automated exposure control, adjustment of the mA and/or kV  according to patient size and/or use of iterative reconstruction technique. CONTRAST:  60mL OMNIPAQUE IOHEXOL 350 MG/ML SOLN COMPARISON:  No prior CTA available, correlation is made with CT head 02/04/2023 FINDINGS: CT HEAD FINDINGS For noncontrast findings, please see same day CT head. CTA NECK FINDINGS Aortic arch: Two-vessel arch with a common origin of the brachiocephalic and left common carotid arteries.  Imaged portion shows no evidence of aneurysm or dissection. No significant stenosis of the major arch vessel origins. Mild aortic atherosclerosis. Right carotid system: No evidence of dissection, occlusion, or hemodynamically significant stenosis (greater than 50%). Atherosclerotic disease at the bifurcation and in the proximal ICA is not hemodynamically significant. Left carotid system: No evidence of dissection, occlusion, or hemodynamically significant stenosis (greater than 50%). Atherosclerotic disease at the bifurcation and in the proximal ICA is not hemodynamically significant. Vertebral arteries: Mild stenosis in the left V1 segment. No other hemodynamically significant stenosis in the vertebral arteries. No evidence Skeleton: No acute osseous abnormality. Degenerative changes in the cervical spine. Diffuse osseous sclerosis is favored to have a metabolic etiology. Other neck: Negative. Upper chest: No focal pulmonary opacity or pleural effusion. Review of the MIP images confirms the above findings CTA HEAD FINDINGS Anterior circulation: Both internal carotid arteries are patent to the termini, with moderate to severe stenosis in the bilateral cavernous segment and mild stenosis in the bilateral supraclinoid segments. A1 segments patent. Normal anterior communicating artery. Anterior cerebral arteries are patent to their distal aspects without significant stenosis. No M1 stenosis or occlusion. MCA branches perfused to their distal aspects without significant stenosis. Posterior circulation: Vertebral arteries patent to the vertebrobasilar junction without significant stenosis. Posterior inferior cerebellar arteries patent proximally. Basilar patent to its distal aspect without significant stenosis. Superior cerebellar arteries patent proximally. Patent P1 segments. Mild stenosis in the proximal right P2 (series 6, image 113). PCAs are otherwise irregular but perfused to their distal aspects. The left  posterior communicating artery is patent. Venous sinuses: As permitted by contrast timing, patent. Anatomic variants: None significant. Review of the MIP images confirms the above findings IMPRESSION: 1. No intracranial large vessel occlusion. 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 2. Mild stenosis in the left V1 segment. No other hemodynamically significant stenosis in the neck. 3. Aortic atherosclerosis. Aortic Atherosclerosis (ICD10-I70.0). Electronically Signed   By: Wiliam Ke M.D.   On: 02/04/2023 19:05   CT Head Wo Contrast  Result Date: 02/04/2023 CLINICAL DATA:  Neuro deficit. Stroke suspected. Weakness and aphasia. EXAM: CT HEAD WITHOUT CONTRAST TECHNIQUE: Contiguous axial images were obtained from the base of the skull through the vertex without intravenous contrast. RADIATION DOSE REDUCTION: This exam was performed according to the departmental dose-optimization program which includes automated exposure control, adjustment of the mA and/or kV according to patient size and/or use of iterative reconstruction technique. COMPARISON:  None Available. FINDINGS: Brain: No subdural, epidural, or subarachnoid hemorrhage. There is a small lacunar infarct in the right cerebellar hemisphere on series 2, image 10 which is nonacute appearance. Suspected nonacute lacunar infarct in the pons. The cerebellum and brainstem are otherwise normal. Basal cisterns are widely patent. No mass effect or midline shift. White matter changes are identified. No acute ischemia or infarct noted. Vascular: Calcified atherosclerotic changes identified in the intracranial carotids. Skull: Normal. Negative for fracture or focal lesion. Sinuses/Orbits: Mucosal thickening is identified in the right maxillary sinus. Other: None. IMPRESSION: 1. No acute intracranial abnormalities. 2. Small lacunar infarct  in the right cerebellar hemisphere is nonacute in  appearance. Suspected nonacute lacunar infarct in the pons. 3. White matter changes consistent with chronic microvascular ischemia. 4. Calcified atherosclerotic changes in the intracranial carotids. 5. Mucosal thickening in the right maxillary sinus. Electronically Signed   By: Gerome Sam III M.D.   On: 02/04/2023 15:55    Microbiology: No results found for this or any previous visit.  Labs: CBC: Recent Labs  Lab 02/04/23 1529 02/05/23 0442  WBC 8.7 9.1  NEUTROABS 4.6  --   HGB 12.4 12.2  HCT 38.9 37.8  MCV 88.6 88.3  PLT 258 257   Basic Metabolic Panel: Recent Labs  Lab 02/04/23 1529 02/05/23 0442  NA 135 135  K 4.7 4.2  CL 104 104  CO2 20* 20*  GLUCOSE 226* 189*  BUN 36* 38*  CREATININE 1.60* 1.25*  CALCIUM 9.3 9.2   Liver Function Tests: Recent Labs  Lab 02/04/23 1529 02/05/23 0442  AST 16 13*  ALT 14 14  ALKPHOS 61 67  BILITOT 0.5 0.6  PROT 7.7 7.2  ALBUMIN 4.1 3.8   CBG: Recent Labs  Lab 02/04/23 2332 02/05/23 0759 02/05/23 1115  GLUCAP 258* 233* 246*    Discharge time spent: 35 minutes.  Signed: Jacquelin Hawking, MD [facility] 02/05/2023

## 2023-02-05 NOTE — Progress Notes (Signed)
*  PRELIMINARY RESULTS* Echocardiogram 2D Echocardiogram has been performed.  Robin Chandler 02/05/2023, 12:38 PM

## 2023-02-05 NOTE — TOC Transition Note (Signed)
Transition of Care [facility]) - CM/SW Discharge Note   Patient Details  Name: Robin Chandler MRN: [id number] Date of Birth: [birth year]  [facility] CM/SW Contact:  Villa Herb, LCSWA Phone Number: 02/05/2023, 1:01 PM   Clinical Narrative:    CSW updated that PT is recommending outpatient PT for pt. CSW spoke with pt about no insurance. Pt states that she does not have insurance at this time but is in the process of getting Medicare benefits. Pt state she would like an outpatient PT referral to [facility] outpatient rehab. CSW made referral at pt request. TOC signing off.   Final next level of care: OP Rehab Barriers to Discharge: Barriers Resolved   Patient Goals and CMS Choice CMS Medicare.gov Compare Post Acute Care list provided to:: Patient Choice offered to / list presented to : Patient  Discharge Placement                         Discharge Plan and Services Additional resources added to the After Visit Summary for                                       Social Determinants of Health (SDOH) Interventions SDOH Screenings   Food Insecurity: No Food Insecurity (02/04/2023)  Housing: Low Risk  (02/04/2023)  Transportation Needs: No Transportation Needs (02/04/2023)  Utilities: Not At Risk (02/04/2023)  Tobacco Use: High Risk (02/04/2023)     Readmission Risk Interventions     No data to display

## 2023-02-05 NOTE — Assessment & Plan Note (Signed)
-   Creatinine is up to 1.60 - The most recent creatinine we have to compare with is 62 years old at 17.99 - It is likely that her new baseline is somewhere in between these - Encourage patient to take p.o. intake - Trend in the a.m. - Avoid nephrotoxic agents when possible

## 2023-02-05 NOTE — Inpatient Diabetes Management (Addendum)
Inpatient Diabetes Program Recommendations  AACE/ADA: New Consensus Statement on Inpatient Glycemic Control   Target Ranges:  Prepandial:   less than 140 mg/dL      Peak postprandial:   less than 180 mg/dL (1-2 hours)      Critically ill patients:  140 - 180 mg/dL    Latest Reference Range & Units 02/04/23 23:32 02/05/23 07:59  Glucose-Capillary 70 - 99 mg/dL 409 (H) 811 (H)    Latest Reference Range & Units 02/05/23 04:42  Hemoglobin A1C 4.8 - 5.6 % 9.1 (H)   Review of Glycemic Control  Diabetes history: DM2 Outpatient Diabetes medications: Lantus 20 units QHS, Janumet 50-1000 mg BID, Jardiance 25 mg daily Current orders for Inpatient glycemic control: Semglee 10 units QHS, Novolog 0-15 units TID with meals, Novolog 0-5 units QHS  Inpatient Diabetes Program Recommendations:    Insulin: Please consider increasing Semglee to 12 units QHS.  HbgA1C: A1C 9.1% on 02/05/23 indicating an average glucose of 214 mg/dl over the past 2-3 months.  NOTE: Patient admitted with CVA. Per chart, patient does NOT have any insurance and PCP is listed as [facility]. Will plan to speak with patient today regarding A1C.  Addendum 02/05/23@13 :00-Spoke with patient at bedside about diabetes and home regimen for diabetes control. Patient reports being followed by [facility]) for diabetes management and currently taking Lantus 20 units QHS, Jardiance 25 mg daily, Janumet 50-1000 mg BID as an outpatient for diabetes control. Patient reports taking DM medications as prescribed and actually had an appointment scheduled today with the [facility] so she plans to call and reschedule. Patient reports checking glucose once a day in the mornings around 9am. Patient states that glucose is usually in the low 100's in the mornings and she has had glucose as low as 50's but notes that only happens about 1-2 times a month when she doesn't eat as much as usual.  Discussed A1C results  (9.1% on 02/05/23) and explained that current A1C indicates an average glucose of 214 mg/dl over the past 2-3 months. Discussed glucose and A1C goals. Discussed importance of checking CBGs and maintaining good CBG control to prevent long-term and short-term complications. Explained how hyperglycemia leads to damage within blood vessels which lead to the common complications seen with uncontrolled diabetes. Stressed to the patient the importance of improving glycemic control to prevent further complications from uncontrolled diabetes. Encouraged patient to start checking glucose at least 2-3 times a day and to reach out to PCP if glucose is staying consistently over 180 mg/dl so PCP can make adjustments.  Patient states that she is able to get DM medications from [facility] and she has plenty of DM medications at this time.  Patient verbalized understanding of information discussed and reports no further questions at this time related to diabetes.  Thanks, Orlando Penner, RN, MSN, CDCES Diabetes Coordinator Inpatient Diabetes Program [phone] (Team Pager from 8am to 5pm)

## 2023-02-05 NOTE — Evaluation (Signed)
Speech Language Pathology Evaluation Patient Details Name: Robin Chandler MRN: [id number] DOB: 05-08-1961 Today's Date: 02/05/2023 Time: 9528-4132 SLP Time Calculation (min) (ACUTE ONLY): 26 min  Problem List:  Patient Active Problem List   Diagnosis Date Noted   Elevated serum creatinine 02/05/2023   DMII (diabetes mellitus, type 2) (HCC) 02/05/2023   HTN (hypertension) 02/05/2023   Tobacco use disorder 02/05/2023   CVA (cerebral vascular accident) (HCC) 02/04/2023   Past Medical History:  Past Medical History:  Diagnosis Date   Diabetes mellitus without complication (HCC)    Hypertension    Past Surgical History:  Past Surgical History:  Procedure Laterality Date   CHOLECYSTECTOMY     HPI:  Robin Chandler is a 62 y.o. female with PMH of HTN, DM, HLD, nicotine use disorder who presented with left sided weakness. States she was LKN on 02/03/2023 at around 0600 when she went to bed. She woke up around noon and noticed her left side was weak. Her symptoms persisted on Sunday and family noted facial droop as well as slurred speech so brought her to [facility] ED. Denies prior history of seizures. Takes ASA 81mg  daily. MRI Brain 02/05/2023: Acute infarction affecting the right para median pons. No  hemorrhage or mass effect. Chronic small-vessel ischemic changes elsewhere throughout the  brain as outlined above. Acute right maxillary sinusitis.  Marland Kitchen SLE ordered.   Assessment / Plan / Recommendation Clinical Impression  Cognitive linguistic evaluation completed in room. Pt appears to be at her baseline and denies changes in speech, language, swallowing, or cognition. She does have mild left facial weakness, however is not impacting her functionally. Pt scored a 26/30 on the [facility] with a normal score being 27+. She missed one detail in paragraph auditory comprehension ("when children were grown" vs teenagers") and switched placement of hands. Pt indicates she feels at baseline. 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 No further SLP services indicated at this time. SLP will sign off.    SLP Assessment  SLP Recommendation/Assessment: Patient does not need any further Speech Lanaguage Pathology Services SLP Visit Diagnosis: Cognitive communication deficit (R41.841)    Recommendations for follow up therapy are one component of a multi-disciplinary discharge planning process, led by the attending physician.  Recommendations may be updated based on patient status, additional functional criteria and insurance authorization.    Follow Up Recommendations  No SLP follow up    Assistance Recommended at Discharge  None  Functional Status Assessment Patient has not had a recent decline in their functional status  Frequency and Duration           SLP Evaluation Cognition  Overall Cognitive Status: Within Functional Limits for tasks assessed Arousal/Alertness: Awake/alert Orientation Level: Oriented X4 Year: 2024 Month: April Day of Week: Correct Memory: Appears intact Awareness: Appears intact Problem Solving: Appears intact Safety/Judgment: Appears intact       Comprehension  Auditory Comprehension Overall Auditory Comprehension: Appears within functional limits for tasks assessed Yes/No Questions: Within Functional Limits Commands: Within Functional Limits Conversation: Complex Visual Recognition/Discrimination Discrimination: Within Function Limits Reading Comprehension Reading Status: Not tested    Expression Expression Primary Mode of Expression: Verbal Verbal Expression Overall Verbal Expression: Appears within functional limits for tasks assessed Initiation: No impairment Level of Generative/Spontaneous Verbalization: Conversation Repetition: No impairment Naming: No impairment Pragmatics: No impairment Non-Verbal Means of Communication: Not applicable Written Expression Dominant Hand: Right Written Expression: Not  tested  Oral / Motor  Oral Motor/Sensory Function Overall Oral Motor/Sensory Function: Mild impairment Facial ROM: Reduced left;Suspected CN VII (facial) dysfunction Facial Symmetry: Abnormal symmetry left Facial Strength: Reduced left;Suspected CN VII (facial) dysfunction Facial Sensation: Within Functional Limits Lingual ROM: Within Functional Limits Lingual Symmetry: Within Functional Limits Lingual Strength: Within Functional Limits Lingual Sensation: Within Functional Limits Velum: Within Functional Limits Mandible: Within Functional Limits Motor Speech Overall Motor Speech: Appears within functional limits for tasks assessed Respiration: Within functional limits Phonation: Normal Resonance: Within functional limits (? mild hyponasal, Pt says baseline) Articulation: Within functional limitis Intelligibility: Intelligible Motor Planning: Witnin functional limits Motor Speech Errors: Not applicable           Thank you,  Havery Moros, CCC-SLP [phone]  Robin Chandler 02/05/2023, 3:08 PM

## 2023-02-06 ENCOUNTER — Observation Stay: Payer: Self-pay

## 2023-02-22 ENCOUNTER — Telehealth: Payer: Self-pay

## 2023-02-22 NOTE — Telephone Encounter (Signed)
Received paperwork from [facility] for FMLA. Patient does not have an appointment until August so unable to complete this paperwork now. Called and spoke with patient to advise her of same, and she is going to ask her PCP to complete the paperwork for her for right now. I let her know we would file the forms until her appointment

## 2023-03-23 ENCOUNTER — Other Ambulatory Visit: Payer: Self-pay

## 2023-03-23 ENCOUNTER — Ambulatory Visit (HOSPITAL_COMMUNITY): Payer: Medicaid Other | Attending: Family Medicine

## 2023-03-23 DIAGNOSIS — R2689 Other abnormalities of gait and mobility: Secondary | ICD-10-CM | POA: Insufficient documentation

## 2023-03-23 DIAGNOSIS — R262 Difficulty in walking, not elsewhere classified: Secondary | ICD-10-CM | POA: Diagnosis present

## 2023-03-23 DIAGNOSIS — M6281 Muscle weakness (generalized): Secondary | ICD-10-CM | POA: Diagnosis present

## 2023-03-23 DIAGNOSIS — R4701 Aphasia: Secondary | ICD-10-CM | POA: Insufficient documentation

## 2023-03-23 DIAGNOSIS — I639 Cerebral infarction, unspecified: Secondary | ICD-10-CM | POA: Insufficient documentation

## 2023-03-23 NOTE — Therapy (Addendum)
OUTPATIENT PHYSICAL THERAPY NEURO EVALUATION   Patient Name: Robin Chandler MRN: [id number] DOB:[birth year], 62 y.o., female Today's Date: 03/23/2023   PCP: [facility] REFERRING PROVIDER: Narda Bonds, MD  END OF SESSION:  PT End of Session - 03/23/23 0816     Visit Number 1    Number of Visits 8    Date for PT Re-Evaluation 04/20/23    Authorization Type [employer] Medicaid    Authorization Time Period needs auth after 30 visits    Authorization - Number of Visits 30    PT Start Time 0815    PT Stop Time 0855    PT Time Calculation (min) 40 min    Activity Tolerance Patient tolerated treatment well    Behavior During Therapy WFL for tasks assessed/performed             Past Medical History:  Diagnosis Date   Diabetes mellitus without complication (HCC)    Hypertension    Past Surgical History:  Procedure Laterality Date   CHOLECYSTECTOMY     Patient Active Problem List   Diagnosis Date Noted   Elevated serum creatinine 02/05/2023   DMII (diabetes mellitus, type 2) (HCC) 02/05/2023   HTN (hypertension) 02/05/2023   Tobacco use disorder 02/05/2023   CVA (cerebral vascular accident) (HCC) 02-28-2023    ONSET DATE: 02-28-2023 CVA  REFERRING DIAG: R47.01 (ICD-10-CM) - Aphasia I63.9 (ICD-10-CM) - Cerebrovascular accident (CVA), unspecified mechanism (HCC)  THERAPY DIAG:  Cerebrovascular accident (CVA), unspecified mechanism (HCC) - Plan: PT plan of care cert/re-cert  Difficulty in walking, not elsewhere classified - Plan: PT plan of care cert/re-cert  Other abnormalities of gait and mobility - Plan: PT plan of care cert/re-cert  Muscle weakness (generalized) - Plan: PT plan of care cert/re-cert  Abnormality of gait due to impairment of balance - Plan: PT plan of care cert/re-cert  Rationale for Evaluation and Treatment: Rehabilitation  SUBJECTIVE:                                                                                                                                                                                              SUBJECTIVE STATEMENT: CvA 02/28/23; her sister had passed away; was traveling to and from [place]; had to come home to go to work and then church the next day; felt weak; started having some left side weakness.  Her daughter recognized signs of stroke her mouth drooping on one side.  Went to the hospital; left side is still weak occasionally.  Using nicotine patches to quit smoking.  Now taking Plavix Pt accompanied by: self  PERTINENT HISTORY: DM; sounds like  some vertigo symptoms as well.  PAIN:  Are you having pain? No  PRECAUTIONS: Fall  WEIGHT BEARING RESTRICTIONS: No  FALLS: Has patient fallen in last 6 months? Yes. Number of falls 1  had one fall at home with trying to change some clothing out in home  LIVING ENVIRONMENT: Lives with: lives alone and right now living with her brother Lives in: House/apartment Stairs: Yes: External: 5 steps; on right going up, on left going up, and can reach both Has following equipment at home: None  PLOF: Independent  PATIENT GOALS: getting left leg stronger; getting my balance better;go back to work; [name]  OBJECTIVE:   DIAGNOSTIC FINDINGS:  CLINICAL DATA:  Neuro deficit, acute, stroke suspected. Weakness and aphasia upon presentation yesterday.   EXAM: MRI HEAD WITHOUT CONTRAST   TECHNIQUE: Multiplanar, multiecho pulse sequences of the brain and surrounding structures were obtained without intravenous contrast.   COMPARISON:  CT studies 02/04/2023   FINDINGS: Brain: Diffusion imaging shows acute infarction affecting the right para median pons. No other acute insult. There is an old small vessel infarction in the left side of the pons. There are old small vessel cerebellar infarctions. Cerebral hemispheres show mild chronic small-vessel ischemic changes of the white matter. Old lacunar infarction left caudate head. No large  vessel territory stroke. No mass, hemorrhage, hydrocephalus or extra-axial collection.   Vascular: Major vessels at the base of the brain show flow.   Skull and upper cervical spine: Negative   Sinuses/Orbits: Acute inflammatory changes affecting the right maxillary sinus. Orbits negative.   Other: None   IMPRESSION: 1. Acute infarction affecting the right para median pons. No hemorrhage or mass effect. 2. Chronic small-vessel ischemic changes elsewhere throughout the brain as outlined above. 3. Acute right maxillary sinusitis.      COGNITION: Overall cognitive status: Within functional limits for tasks assessed   SENSATION: WFL  COORDINATION: Impaired heel to shin left heel to right shin; normal right heel to left shin  EDEMA:  None noted     LOWER EXTREMITY ROM:     Active  Right Eval Left Eval  Hip flexion    Hip extension    Hip abduction    Hip adduction    Hip internal rotation    Hip external rotation    Knee flexion    Knee extension    Ankle dorsiflexion    Ankle plantarflexion    Ankle inversion    Ankle eversion     (Blank rows = not tested)  LOWER EXTREMITY MMT:    MMT Right Eval Left Eval  Hip flexion 4+ 4-  Hip extension    Hip abduction    Hip adduction    Hip internal rotation    Hip external rotation    Knee flexion    Knee extension 5 4-  Ankle dorsiflexion 4+ 3  Ankle plantarflexion    Ankle inversion    Ankle eversion    (Blank rows = not tested)  BED MOBILITY:  Check next visit  TRANSFERS: Assistive device utilized: None  Sit to stand: Modified independence and SBA Stand to sit: Modified independence Chair to chair: Modified independence Floor:  not tested   STAIRS: Level of Assistance: SBA and CGA Stair Negotiation Technique: Step to Pattern with Bilateral Rails Number of Stairs: 8  Height of Stairs: 4 to 8 inches  Comments: difficulty with step up leading with left; one step at a time  descending  GAIT: Gait pattern: decreased stance  time- Left Distance walked: 50 ft in clinic Assistive device utilized: None Level of assistance: Modified independence and SBA Comments: slight decreased stance left leg  FUNCTIONAL TESTS:  5 times sit to stand: 30.00 sec 2 minute walk test: next visit SLS   5 sec on right; 1 sec on left  PATIENT SURVEYS:  LEFS 25/80  TODAY'S TREATMENT:                                                                                                                              DATE: 03/23/23 physical therapy evaluation and HEP instruction    PATIENT EDUCATION: Education details: Patient educated on exam findings, POC, scope of PT, HEP, and  what to expect next treatment . Person educated: Patient Education method: Explanation, Demonstration, and Handouts Education comprehension: verbalized understanding, returned demonstration, verbal cues required, and tactile cues required  HOME EXERCISE PROGRAM: Access Code: [facility] URL: [email or web address] Date: 03/23/2023 Prepared by: [facility] - Rehab  Exercises - Sit to Stand  - 2 x daily - 7 x weekly - 1 sets - 10 reps - Standing Single Leg Stance with Counter Support  - 2 x daily - 7 x weekly - 1 sets - 3 reps - 10 sec hold - Seated Heel Toe Raises  - 2 x daily - 7 x weekly - 1 sets - 10 reps - Seated March  - 2 x daily - 7 x weekly - 1 sets - 10 reps - Seated Long Arc Quad  - 2 x daily - 7 x weekly - 1 sets - 10 reps - 2 sec hold  GOALS: Goals reviewed with patient? No  SHORT TERM GOALS: Target date: 04/06/23  patient will be independent with initial HEP  Baseline: Goal status: INITIAL  2.  Patient will self report 30% improvement to improve tolerance for functional activity  Baseline:  Goal status: INITIAL   LONG TERM GOALS: Target date: 04/20/2023  Patient will be independent in self management strategies to improve quality of life and functional outcomes.  Baseline:   Goal status: INITIAL  2.  Patient will self report 50% improvement to improve tolerance for functional activity  Baseline:  Goal status: INITIAL  3.  Patient will increase left leg MMTs to 4+ to 5/5 without pain to promote return to ambulation community distances with minimal deviation.  Baseline: see above Goal status: INITIAL  4.  Patient will improve 5 times sit to stand score from 30 sec to 20 sec to demonstrate improved functional mobility and increased lower extremity strength.  Baseline:  Goal status: INITIAL  5.  Patient will improve LEFS score by 5 points (30/80) Baseline: 25/80 Goal status: INITIAL  6.  Patient will be able to balance SLS x 10" each leg to demonstrate improved functional balance Baseline:  Goal status: INITIAL  ASSESSMENT:  CLINICAL IMPRESSION: Patient is a 63 y.o. female who was seen today for physical therapy evaluation and treatment for R47.01 (ICD-10-CM) -  Aphasia I63.9 (ICD-10-CM) - Cerebrovascular accident (CVA), unspecified mechanism (HCC). Patient demonstrates decreased strength, balance deficits and gait abnormalities which are negatively impacting patient ability to perform ADLs and functional mobility tasks. Patient will benefit from skilled physical therapy services to address these deficits to improve level of function with ADLs, functional mobility tasks, and reduce risk for falls.    OBJECTIVE IMPAIRMENTS: Abnormal gait, decreased activity tolerance, decreased balance, decreased coordination, decreased endurance, difficulty walking, decreased strength, increased fascial restrictions, and impaired perceived functional ability.   ACTIVITY LIMITATIONS: bending, standing, squatting, stairs, bed mobility, locomotion level, and caring for others  PARTICIPATION LIMITATIONS: meal prep, cleaning, laundry, shopping, community activity, and church    REHAB POTENTIAL: Good  CLINICAL DECISION MAKING: Evolving/moderate complexity  EVALUATION  COMPLEXITY: Moderate  PLAN:  PT FREQUENCY: 2x/week  PT DURATION: 4 weeks  PLANNED INTERVENTIONS: Therapeutic exercises, Therapeutic activity, Neuromuscular re-education, Balance training, Gait training, Patient/Family education, Joint manipulation, Joint mobilization, Stair training, Orthotic/Fit training, DME instructions, Aquatic Therapy, Dry Needling, Electrical stimulation, Spinal manipulation, Spinal mobilization, Cryotherapy, Moist heat, Compression bandaging, scar mobilization, Splintting, Taping, Traction, Ultrasound, Ionotophoresis 4mg /ml Dexamethasone, and Manual therapy   PLAN FOR NEXT SESSION: Review HEP and goals; 2 MWT, test glutes and hamstrings, balance, left leg strengthening;    1:12 PM, 03/23/23 Robin Chandler MPT [facility] [place] [phone] Ph:[phone]

## 2023-03-27 ENCOUNTER — Ambulatory Visit (HOSPITAL_COMMUNITY): Payer: Medicaid Other | Admitting: Physical Therapy

## 2023-03-27 DIAGNOSIS — R262 Difficulty in walking, not elsewhere classified: Secondary | ICD-10-CM

## 2023-03-27 DIAGNOSIS — R2689 Other abnormalities of gait and mobility: Secondary | ICD-10-CM

## 2023-03-27 DIAGNOSIS — M6281 Muscle weakness (generalized): Secondary | ICD-10-CM

## 2023-03-27 DIAGNOSIS — I639 Cerebral infarction, unspecified: Secondary | ICD-10-CM

## 2023-03-27 NOTE — Therapy (Signed)
OUTPATIENT PHYSICAL THERAPY TREATMENT   Patient Name: Robin Chandler MRN: [id number] DOB:24-Oct-1960, 62 y.o., female Today's Date: 03/27/2023   PCP: [facility] REFERRING PROVIDER: Narda Bonds, MD  END OF SESSION:  PT End of Session - 03/27/23 0900     Visit Number 2    Number of Visits 8    Date for PT Re-Evaluation 04/20/23    Authorization Type [employer] Medicaid    Authorization Time Period needs auth after 30 visits    Authorization - Number of Visits 30    PT Start Time 0902    PT Stop Time 0945    PT Time Calculation (min) 43 min    Activity Tolerance Patient tolerated treatment well    Behavior During Therapy WFL for tasks assessed/performed             Past Medical History:  Diagnosis Date   Diabetes mellitus without complication (HCC)    Hypertension    Past Surgical History:  Procedure Laterality Date   CHOLECYSTECTOMY     Patient Active Problem List   Diagnosis Date Noted   Elevated serum creatinine 02/05/2023   DMII (diabetes mellitus, type 2) (HCC) 02/05/2023   HTN (hypertension) 02/05/2023   Tobacco use disorder 02/05/2023   CVA (cerebral vascular accident) (HCC) 02/13/2023    ONSET DATE: Feb 13, 2023 CVA  REFERRING DIAG: R47.01 (ICD-10-CM) - Aphasia I63.9 (ICD-10-CM) - Cerebrovascular accident (CVA), unspecified mechanism (HCC)  THERAPY DIAG:  Difficulty in walking, not elsewhere classified  Other abnormalities of gait and mobility  Muscle weakness (generalized)  Cerebrovascular accident (CVA), unspecified mechanism (HCC)  Abnormality of gait due to impairment of balance  Rationale for Evaluation and Treatment: Rehabilitation  SUBJECTIVE:                                                                                                                                                                                             SUBJECTIVE STATEMENT: Pt reports some pain in her Rt knee but rubs some cream on it to help.   Admits to not doing her HEP as she has been busy over the weekend.  States she is having the most trouble getting into and scooting up in her bed at home. Dizziness has really improved and drinking more water.  Evaluation: CVA 02-13-2023; her sister had passed away; was traveling to and from [place]; had to come home to go to work and then church the next day; felt weak; started having some left side weakness.  Her daughter recognized signs of stroke her mouth drooping on one side.  Went to the hospital; left side is still  weak occasionally.  Using nicotine patches to quit smoking.  Now taking Plavix Pt accompanied by: self  PERTINENT HISTORY: DM; sounds like some vertigo symptoms as well.  PAIN:  Are you having pain? No  PRECAUTIONS: Fall  WEIGHT BEARING RESTRICTIONS: No  FALLS: Has patient fallen in last 6 months? Yes. Number of falls 1  had one fall at home with trying to change some clothing out in home  LIVING ENVIRONMENT: Lives with: lives alone and right now living with her brother Lives in: House/apartment Stairs: Yes: External: 5 steps; on right going up, on left going up, and can reach both Has following equipment at home: None  PLOF: Independent  PATIENT GOALS: getting left leg stronger; getting my balance better;go back to work; [name]  OBJECTIVE:   DIAGNOSTIC FINDINGS:  CLINICAL DATA:  Neuro deficit, acute, stroke suspected. Weakness and aphasia upon presentation yesterday.   EXAM: MRI HEAD WITHOUT CONTRAST   TECHNIQUE: Multiplanar, multiecho pulse sequences of the brain and surrounding structures were obtained without intravenous contrast.   COMPARISON:  CT studies 02/04/2023   FINDINGS: Brain: Diffusion imaging shows acute infarction affecting the right para median pons. No other acute insult. There is an old small vessel infarction in the left side of the pons. There are old small vessel cerebellar infarctions. Cerebral hemispheres show  mild chronic small-vessel ischemic changes of the white matter. Old lacunar infarction left caudate head. No large vessel territory stroke. No mass, hemorrhage, hydrocephalus or extra-axial collection.   Vascular: Major vessels at the base of the brain show flow.   Skull and upper cervical spine: Negative   Sinuses/Orbits: Acute inflammatory changes affecting the right maxillary sinus. Orbits negative.   Other: None   IMPRESSION: 1. Acute infarction affecting the right para median pons. No hemorrhage or mass effect. 2. Chronic small-vessel ischemic changes elsewhere throughout the brain as outlined above. 3. Acute right maxillary sinusitis.      COGNITION: Overall cognitive status: Within functional limits for tasks assessed   SENSATION: WFL  COORDINATION: Impaired heel to shin left heel to right shin; normal right heel to left shin  EDEMA:  None noted     LOWER EXTREMITY ROM:     Active  Right Eval Left Eval  Hip flexion    Hip extension    Hip abduction    Hip adduction    Hip internal rotation    Hip external rotation    Knee flexion    Knee extension    Ankle dorsiflexion    Ankle plantarflexion    Ankle inversion    Ankle eversion     (Blank rows = not tested)  LOWER EXTREMITY MMT:    MMT Right Eval Left Eval  Hip flexion 4+ 4-  Hip extension Prone 6/18 3+ Prone 6/18 3  Hip abduction Sidelying 6/18 3+ sidelying 6/18 3  Hip adduction    Hip internal rotation    Hip external rotation    Knee flexion Prone 6/18 4+ Prone 6/18 4  Knee extension 5 4-  Ankle dorsiflexion 4+ 3  Ankle plantarflexion    Ankle inversion    Ankle eversion    (Blank rows = not tested)  BED MOBILITY:  Check next visit  TRANSFERS: Assistive device utilized: None  Sit to stand: Modified independence and SBA Stand to sit: Modified independence Chair to chair: Modified independence Floor:  not tested   STAIRS: Level of Assistance: SBA and CGA Stair  Negotiation Technique: Step to Pattern with  Bilateral Rails Number of Stairs: 8  Height of Stairs: 4 to 8 inches  Comments: difficulty with step up leading with left; one step at a time descending  GAIT: Gait pattern: decreased stance time- Left Distance walked: 50 ft in clinic Assistive device utilized: None Level of assistance: Modified independence and SBA Comments: slight decreased stance left leg  FUNCTIONAL TESTS:  5 times sit to stand: 30.00 sec 2 minute walk test: 305 feet no AD SLS   5 sec on right; 1 sec on left  PATIENT SURVEYS:  LEFS 25/80  TODAY'S TREATMENT:                                                                                                                              DATE:  03/27/23 Seated: LAQ 10X5"  March 10X  Heel/toe raises 10X  Sit to stands no UE 10x 305 feet no AD MMT for remaining hip mm (see above) Goal review Transfer training sit to/from supine, scooting in bed minA cues Supine:  abdominal isometrics 10X5" Bridge 10X  SLR 10X each Sidelying hip abduction 2X10 each Prone: hip extension 2x10 each  03/23/23 physical therapy evaluation and HEP instruction    PATIENT EDUCATION: Education details: Patient educated on exam findings, POC, scope of PT, HEP, and  what to expect next treatment . Person educated: Patient Education method: Explanation, Demonstration, and Handouts Education comprehension: verbalized understanding, returned demonstration, verbal cues required, and tactile cues required  HOME EXERCISE PROGRAM: Access Code: [facility] URL: [email or web address]  Date: 03/23/2023 Prepared by: AP - Rehab Exercises - Sit to Stand  - 2 x daily - 7 x weekly - 1 sets - 10 reps - Standing Single Leg Stance with Counter Support  - 2 x daily - 7 x weekly - 1 sets - 3 reps - 10 sec hold - Seated Heel Toe Raises  - 2 x daily - 7 x weekly - 1 sets - 10 reps - Seated March  - 2 x daily - 7 x weekly - 1 sets - 10 reps -  Seated Long Arc Quad  - 2 x daily - 7 x weekly - 1 sets - 10 reps - 2 sec hold  Date: 03/27/2023 Prepared by: Emeline Gins Exercises - Supine Bridge  - 2 x daily - 7 x weekly - 1 sets - 10 reps - Supine Transversus Abdominis Bracing - Hands on Stomach  - 2 x daily - 7 x weekly - 1 sets - 10 reps - 5 sec hold - Active Straight Leg Raise with Quad Set  - 2 x daily - 7 x weekly - 1 sets - 10 reps - Sidelying Hip Abduction  - 2 x daily - 7 x weekly - 1 sets - 10 reps - Prone Hip Extension  - 2 x daily - 7 x weekly - 1 sets - 10 reps  GOALS: Goals reviewed with patient? Yes  SHORT TERM GOALS: Target date: 04/06/23  patient will be independent with initial HEP  Baseline: Goal status: IN PROGRESS  2.  Patient will self report 30% improvement to improve tolerance for functional activity  Baseline:  Goal status: IN PROGRESS   LONG TERM GOALS: Target date: 04/20/2023  Patient will be independent in self management strategies to improve quality of life and functional outcomes.  Baseline:  Goal status: IN PROGRESS  2.  Patient will self report 50% improvement to improve tolerance for functional activity  Baseline:  Goal status: IN PROGRESS  3.  Patient will increase left leg MMTs to 4+ to 5/5 without pain to promote return to ambulation community distances with minimal deviation.  Baseline: see above Goal status: IN PROGRESS  4.  Patient will improve 5 times sit to stand score from 30 sec to 20 sec to demonstrate improved functional mobility and increased lower extremity strength.  Baseline:  Goal status: IN PROGRESS  5.  Patient will improve LEFS score by 5 points (30/80) Baseline: 25/80 Goal status: IN PROGRESS  6.  Patient will be able to balance SLS x 10" each leg to demonstrate improved functional balance Baseline:  Goal status: IN PROGRESS  ASSESSMENT:  CLINICAL IMPRESSION: Completed 2 minute walk test and tested bil hip strength with bilateral weakness.   Added  therex to address this weakness.   Pt also having difficulty completing basic bed mobility as noted core weakness and difficulty with utilizing mm correctly.  Instructed with logroll technique and worked on this until able to complete without assistance or cues.  Updated HEP to include these activities that address her deficits. Patient will benefit from skilled physical therapy services to address these deficits to improve level of function with ADLs, functional mobility tasks, and reduce risk for falls.    OBJECTIVE IMPAIRMENTS: Abnormal gait, decreased activity tolerance, decreased balance, decreased coordination, decreased endurance, difficulty walking, decreased strength, increased fascial restrictions, and impaired perceived functional ability.   ACTIVITY LIMITATIONS: bending, standing, squatting, stairs, bed mobility, locomotion level, and caring for others  PARTICIPATION LIMITATIONS: meal prep, cleaning, laundry, shopping, community activity, and church    REHAB POTENTIAL: Good  CLINICAL DECISION MAKING: Evolving/moderate complexity  EVALUATION COMPLEXITY: Moderate  PLAN:  PT FREQUENCY: 2x/week  PT DURATION: 4 weeks  PLANNED INTERVENTIONS: Therapeutic exercises, Therapeutic activity, Neuromuscular re-education, Balance training, Gait training, Patient/Family education, Joint manipulation, Joint mobilization, Stair training, Orthotic/Fit training, DME instructions, Aquatic Therapy, Dry Needling, Electrical stimulation, Spinal manipulation, Spinal mobilization, Cryotherapy, Moist heat, Compression bandaging, scar mobilization, Splintting, Taping, Traction, Ultrasound, Ionotophoresis 4mg /ml Dexamethasone, and Manual therapy   PLAN FOR NEXT SESSION: Focus on balance, left leg strengthening.    9:00 AM, 03/27/23 Lurena Nida, PTA/CLT [facility] Ph: [phone]

## 2023-04-05 ENCOUNTER — Ambulatory Visit (HOSPITAL_COMMUNITY): Payer: Medicaid Other | Admitting: Physical Therapy

## 2023-04-05 DIAGNOSIS — I639 Cerebral infarction, unspecified: Secondary | ICD-10-CM | POA: Diagnosis not present

## 2023-04-05 DIAGNOSIS — R262 Difficulty in walking, not elsewhere classified: Secondary | ICD-10-CM

## 2023-04-05 DIAGNOSIS — M6281 Muscle weakness (generalized): Secondary | ICD-10-CM

## 2023-04-05 DIAGNOSIS — R2689 Other abnormalities of gait and mobility: Secondary | ICD-10-CM

## 2023-04-05 NOTE — Therapy (Signed)
OUTPATIENT PHYSICAL THERAPY TREATMENT   Patient Name: Robin Chandler MRN: [id number] DOB:1960-11-26, 62 y.o., female Today's Date: 04/05/2023   PCP: [facility] REFERRING PROVIDER: Narda Bonds, MD  END OF SESSION:  PT End of Session - 04/05/23 1158     Visit Number 3    Number of Visits 8    Date for PT Re-Evaluation 04/20/23    Authorization Type [employer] Medicaid    Authorization Time Period needs auth after 30 visits    Authorization - Number of Visits 30    PT Start Time 1120    PT Stop Time 1158    PT Time Calculation (min) 40 min    Activity Tolerance Patient tolerated treatment well    Behavior During Therapy WFL for tasks assessed/performed             Past Medical History:  Diagnosis Date   Diabetes mellitus without complication (HCC)    Hypertension    Past Surgical History:  Procedure Laterality Date   CHOLECYSTECTOMY     Patient Active Problem List   Diagnosis Date Noted   Elevated serum creatinine 02/05/2023   DMII (diabetes mellitus, type 2) (HCC) 02/05/2023   HTN (hypertension) 02/05/2023   Tobacco use disorder 02/05/2023   CVA (cerebral vascular accident) (HCC) 02/13/2023    ONSET DATE: 2023/02/13 CVA  REFERRING DIAG: R47.01 (ICD-10-CM) - Aphasia I63.9 (ICD-10-CM) - Cerebrovascular accident (CVA), unspecified mechanism (HCC)  THERAPY DIAG:  Difficulty in walking, not elsewhere classified  Other abnormalities of gait and mobility  Muscle weakness (generalized)  Cerebrovascular accident (CVA), unspecified mechanism (HCC)  Rationale for Evaluation and Treatment: Rehabilitation  SUBJECTIVE:  PT states that her BP has been high, she is not feeling any different.  146/78 pulse 75                                                                                                                                                                                            SUBJECTIVE STATEMENT: Evaluation: CVA Feb 13, 2023; her sister  had passed away; was traveling to and from [place]; had to come home to go to work and then church the next day; felt weak; started having some left side weakness.  Her daughter recognized signs of stroke her mouth drooping on one side.  Went to the hospital; left side is still weak occasionally.  Using nicotine patches to quit smoking.  Now taking Plavix Pt accompanied by: self  PERTINENT HISTORY: DM; sounds like some vertigo symptoms as well.  PAIN:  Are you having pain? No  PRECAUTIONS: Fall  WEIGHT BEARING RESTRICTIONS: No  FALLS: Has patient fallen  in last 6 months? Yes. Number of falls 1  had one fall at home with trying to change some clothing out in home  LIVING ENVIRONMENT: Lives with: lives alone and right now living with her brother Lives in: House/apartment Stairs: Yes: External: 5 steps; on right going up, on left going up, and can reach both Has following equipment at home: None  PLOF: Independent  PATIENT GOALS: getting left leg stronger; getting my balance better;go back to work; [name]  OBJECTIVE:   DIAGNOSTIC FINDINGS:  CLINICAL DATA:  Neuro deficit, acute, stroke suspected. Weakness and aphasia upon presentation yesterday.   EXAM: MRI HEAD WITHOUT CONTRAST   TECHNIQUE: Multiplanar, multiecho pulse sequences of the brain and surrounding structures were obtained without intravenous contrast.   COMPARISON:  CT studies 02/04/2023   FINDINGS: Brain: Diffusion imaging shows acute infarction affecting the right para median pons. No other acute insult. There is an old small vessel infarction in the left side of the pons. There are old small vessel cerebellar infarctions. Cerebral hemispheres show mild chronic small-vessel ischemic changes of the white matter. Old lacunar infarction left caudate head. No large vessel territory stroke. No mass, hemorrhage, hydrocephalus or extra-axial collection.   Vascular: Major vessels at the base of the  brain show flow.   Skull and upper cervical spine: Negative   Sinuses/Orbits: Acute inflammatory changes affecting the right maxillary sinus. Orbits negative.   Other: None   IMPRESSION: 1. Acute infarction affecting the right para median pons. No hemorrhage or mass effect. 2. Chronic small-vessel ischemic changes elsewhere throughout the brain as outlined above. 3. Acute right maxillary sinusitis.      COGNITION: Overall cognitive status: Within functional limits for tasks assessed   SENSATION: WFL  COORDINATION: Impaired heel to shin left heel to right shin; normal right heel to left shin  EDEMA:  None noted     LOWER EXTREMITY ROM:     Active  Right Eval Left Eval  Hip flexion    Hip extension    Hip abduction    Hip adduction    Hip internal rotation    Hip external rotation    Knee flexion    Knee extension    Ankle dorsiflexion    Ankle plantarflexion    Ankle inversion    Ankle eversion     (Blank rows = not tested)  LOWER EXTREMITY MMT:    MMT Right Eval Left Eval  Hip flexion 4+ 4-  Hip extension Prone 6/18 3+ Prone 6/18 3  Hip abduction Sidelying 6/18 3+ sidelying 6/18 3  Hip adduction    Hip internal rotation    Hip external rotation    Knee flexion Prone 6/18 4+ Prone 6/18 4  Knee extension 5 4-  Ankle dorsiflexion 4+ 3  Ankle plantarflexion    Ankle inversion    Ankle eversion    (Blank rows = not tested)  BED MOBILITY:  Check next visit  TRANSFERS: Assistive device utilized: None  Sit to stand: Modified independence and SBA Stand to sit: Modified independence Chair to chair: Modified independence Floor:  not tested   STAIRS: Level of Assistance: SBA and CGA Stair Negotiation Technique: Step to Pattern with Bilateral Rails Number of Stairs: 8  Height of Stairs: 4 to 8 inches  Comments: difficulty with step up leading with left; one step at a time descending  GAIT: Gait pattern: decreased stance time-  Left Distance walked: 50 ft in clinic Assistive device utilized: None Level of  assistance: Modified independence and SBA Comments: slight decreased stance left leg  FUNCTIONAL TESTS:  5 times sit to stand: 30.00 sec 2 minute walk test: 305 feet no AD SLS   5 sec on right; 1 sec on left  PATIENT SURVEYS:  LEFS 25/80  TODAY'S TREATMENT:                                                                                                                              DATE:  04/05/23: Sitting:   Sit to stand  Standing : Side step with green theraband  x 2 RT  Heel raises x 15 Lunge onto 4" step B x 10  Single leg stance:  Rt: 5"  , LT:  0 5 x each  Tandem with head turns  Step up onto 4" step Nustep level 2 x 5'  03/27/23 Seated: LAQ 10X5"  March 10X  Heel/toe raises 10X  Sit to stands no UE 10x 305 feet no AD MMT for remaining hip mm (see above) Goal review Transfer training sit to/from supine, scooting in bed minA cues Supine:  abdominal isometrics 10X5" Bridge 10X  SLR 10X each Sidelying hip abduction 2X10 each Prone: hip extension 2x10 each  03/23/23 physical therapy evaluation and HEP instruction    PATIENT EDUCATION: Education details: Patient educated on exam findings, POC, scope of PT, HEP, and  what to expect next treatment . Person educated: Patient Education method: Explanation, Demonstration, and Handouts Education comprehension: verbalized understanding, returned demonstration, verbal cues required, and tactile cues required  HOME EXERCISE PROGRAM: Access Code: [facility] URL: [email or web address]  Date: 03/23/2023 Prepared by: AP - Rehab Exercises - Sit to Stand  - 2 x daily - 7 x weekly - 1 sets - 10 reps - Standing Single Leg Stance with Counter Support  - 2 x daily - 7 x weekly - 1 sets - 3 reps - 10 sec hold - Seated Heel Toe Raises  - 2 x daily - 7 x weekly - 1 sets - 10 reps - Seated March  - 2 x daily - 7 x weekly - 1 sets - 10  reps - Seated Long Arc Quad  - 2 x daily - 7 x weekly - 1 sets - 10 reps - 2 sec hold  Date: 03/27/2023 Prepared by: Emeline Gins Exercises - Supine Bridge  - 2 x daily - 7 x weekly - 1 sets - 10 reps - Supine Transversus Abdominis Bracing - Hands on Stomach  - 2 x daily - 7 x weekly - 1 sets - 10 reps - 5 sec hold - Active Straight Leg Raise with Quad Set  - 2 x daily - 7 x weekly - 1 sets - 10 reps - Sidelying Hip Abduction  - 2 x daily - 7 x weekly - 1 sets - 10 reps - Prone Hip Extension  - 2 x daily - 7 x weekly - 1 sets - 10 reps  GOALS: Goals reviewed with patient? Yes  SHORT TERM GOALS: Target date: 04/06/23  patient will be independent with initial HEP  Baseline: Goal status: IN PROGRESS  2.  Patient will self report 30% improvement to improve tolerance for functional activity  Baseline:  Goal status: IN PROGRESS   LONG TERM GOALS: Target date: 04/20/2023  Patient will be independent in self management strategies to improve quality of life and functional outcomes.  Baseline:  Goal status: IN PROGRESS  2.  Patient will self report 50% improvement to improve tolerance for functional activity  Baseline:  Goal status: IN PROGRESS  3.  Patient will increase left leg MMTs to 4+ to 5/5 without pain to promote return to ambulation community distances with minimal deviation.  Baseline: see above Goal status: IN PROGRESS  4.  Patient will improve 5 times sit to stand score from 30 sec to 20 sec to demonstrate improved functional mobility and increased lower extremity strength.  Baseline:  Goal status: IN PROGRESS  5.  Patient will improve LEFS score by 5 points (30/80) Baseline: 25/80 Goal status: IN PROGRESS  6.  Patient will be able to balance SLS x 10" each leg to demonstrate improved functional balance Baseline:  Goal status: IN PROGRESS  ASSESSMENT:  CLINICAL IMPRESSION: Therapist advanced pt exercises to WB only.  Pt fatigues easily recommended a walking  program beginning 5 minutes twice a day every day , when this gets easy add a minute continue until pt can walk for 20 minutes without stopping.  Patient will benefit from skilled physical therapy services to address these deficits to improve level of function with ADLs, functional mobility tasks, and reduce risk for falls.    OBJECTIVE IMPAIRMENTS: Abnormal gait, decreased activity tolerance, decreased balance, decreased coordination, decreased endurance, difficulty walking, decreased strength, increased fascial restrictions, and impaired perceived functional ability.   ACTIVITY LIMITATIONS: bending, standing, squatting, stairs, bed mobility, locomotion level, and caring for others  PARTICIPATION LIMITATIONS: meal prep, cleaning, laundry, shopping, community activity, and church    REHAB POTENTIAL: Good  CLINICAL DECISION MAKING: Evolving/moderate complexity  EVALUATION COMPLEXITY: Moderate  PLAN:  PT FREQUENCY: 2x/week  PT DURATION: 4 weeks  PLANNED INTERVENTIONS: Therapeutic exercises, Therapeutic activity, Neuromuscular re-education, Balance training, Gait training, Patient/Family education, Joint manipulation, Joint mobilization, Stair training, Orthotic/Fit training, DME instructions, Aquatic Therapy, Dry Needling, Electrical stimulation, Spinal manipulation, Spinal mobilization, Cryotherapy, Moist heat, Compression bandaging, scar mobilization, Splintting, Taping, Traction, Ultrasound, Ionotophoresis 4mg /ml Dexamethasone, and Manual therapy   PLAN FOR NEXT SESSION: Focus on balance, left leg strengthening.    11:59 AM, 04/05/23 Virgina Organ, PT CLT [phone] [facility] Ph: [phone]

## 2023-04-11 ENCOUNTER — Ambulatory Visit (HOSPITAL_COMMUNITY): Payer: Medicaid Other | Attending: Family Medicine | Admitting: Physical Therapy

## 2023-04-11 DIAGNOSIS — I639 Cerebral infarction, unspecified: Secondary | ICD-10-CM | POA: Diagnosis present

## 2023-04-11 DIAGNOSIS — M6281 Muscle weakness (generalized): Secondary | ICD-10-CM | POA: Insufficient documentation

## 2023-04-11 DIAGNOSIS — R262 Difficulty in walking, not elsewhere classified: Secondary | ICD-10-CM | POA: Diagnosis present

## 2023-04-11 DIAGNOSIS — R2689 Other abnormalities of gait and mobility: Secondary | ICD-10-CM | POA: Diagnosis present

## 2023-04-11 NOTE — Therapy (Signed)
OUTPATIENT PHYSICAL THERAPY TREATMENT   Patient Name: Robin Chandler MRN: [id number] DOB:[birth year], 62 y.o., female Today's Date: 04/11/2023   PCP: [facility] REFERRING PROVIDER: Narda Bonds, MD  END OF SESSION:   PT End of Session - 04/11/23 1127     Visit Number 4    Number of Visits 8    Date for PT Re-Evaluation 04/20/23    Authorization Type [employer]    Authorization Time Period needs auth after 30 visits    Authorization - Number of Visits 30    PT Start Time 1125   pt was late for appt   PT Stop Time 1206    PT Time Calculation (min) 41 min    Activity Tolerance Patient tolerated treatment well    Behavior During Therapy [facility] for tasks assessed/performed              Past Medical History:  Diagnosis Date   Diabetes mellitus without complication (HCC)    Hypertension    Past Surgical History:  Procedure Laterality Date   CHOLECYSTECTOMY     Patient Active Problem List   Diagnosis Date Noted   Elevated serum creatinine 02/05/2023   DMII (diabetes mellitus, type 2) (HCC) 02/05/2023   HTN (hypertension) 02/05/2023   Tobacco use disorder 02/05/2023   CVA (cerebral vascular accident) (HCC) 15-Feb-2023    ONSET DATE: 2023-02-15 CVA  REFERRING DIAG: R47.01 (ICD-10-CM) - Aphasia I63.9 (ICD-10-CM) - Cerebrovascular accident (CVA), unspecified mechanism (HCC)  THERAPY DIAG:  Difficulty in walking, not elsewhere classified  Other abnormalities of gait and mobility  Muscle weakness (generalized)  Rationale for Evaluation and Treatment: Rehabilitation  SUBJECTIVE:  PT states that her BP has been high, she is not feeling any different.  146/78 pulse 75                                                                                                                                                                                            SUBJECTIVE STATEMENT: Pt states she is walking 15 minutes [place] at the mall.  States she is not  going as far as she used to in that amount of time.   Reports she continues to do her HEP daily.   Evaluation: CVA 2023-02-15; her sister had passed away; was traveling to and from [place]; had to come home to go to work and then church the next day; felt weak; started having some left side weakness.  Her daughter recognized signs of stroke her mouth drooping on one side.  Went to the hospital; left side is still weak occasionally.  Using nicotine patches to quit  smoking.  Now taking Plavix Pt accompanied by: self  PERTINENT HISTORY: DM; sounds like some vertigo symptoms as well.  PAIN:  Are you having pain? No  PRECAUTIONS: Fall  WEIGHT BEARING RESTRICTIONS: No  FALLS: Has patient fallen in last 6 months? Yes. Number of falls 1  had one fall at home with trying to change some clothing out in home  LIVING ENVIRONMENT: Lives with: lives alone and right now living with her brother Lives in: House/apartment Stairs: Yes: External: 5 steps; on right going up, on left going up, and can reach both Has following equipment at home: None  PLOF: Independent  PATIENT GOALS: getting left leg stronger; getting my balance better;go back to work; [name]  OBJECTIVE:   DIAGNOSTIC FINDINGS:  CLINICAL DATA:  Neuro deficit, acute, stroke suspected. Weakness and aphasia upon presentation yesterday.   EXAM: MRI HEAD WITHOUT CONTRAST   TECHNIQUE: Multiplanar, multiecho pulse sequences of the brain and surrounding structures were obtained without intravenous contrast.   COMPARISON:  CT studies 02/04/2023   FINDINGS: Brain: Diffusion imaging shows acute infarction affecting the right para median pons. No other acute insult. There is an old small vessel infarction in the left side of the pons. There are old small vessel cerebellar infarctions. Cerebral hemispheres show mild chronic small-vessel ischemic changes of the white matter. Old lacunar infarction left caudate head. No large  vessel territory stroke. No mass, hemorrhage, hydrocephalus or extra-axial collection.   Vascular: Major vessels at the base of the brain show flow.   Skull and upper cervical spine: Negative   Sinuses/Orbits: Acute inflammatory changes affecting the right maxillary sinus. Orbits negative.   Other: None   IMPRESSION: 1. Acute infarction affecting the right para median pons. No hemorrhage or mass effect. 2. Chronic small-vessel ischemic changes elsewhere throughout the brain as outlined above. 3. Acute right maxillary sinusitis.      COGNITION: Overall cognitive status: Within functional limits for tasks assessed   SENSATION: WFL  COORDINATION: Impaired heel to shin left heel to right shin; normal right heel to left shin  EDEMA:  None noted     LOWER EXTREMITY ROM:     Active  Right Eval Left Eval  Hip flexion    Hip extension    Hip abduction    Hip adduction    Hip internal rotation    Hip external rotation    Knee flexion    Knee extension    Ankle dorsiflexion    Ankle plantarflexion    Ankle inversion    Ankle eversion     (Blank rows = not tested)  LOWER EXTREMITY MMT:    MMT Right Eval Left Eval  Hip flexion 4+ 4-  Hip extension Prone 6/18 3+ Prone 6/18 3  Hip abduction Sidelying 6/18 3+ sidelying 6/18 3  Hip adduction    Hip internal rotation    Hip external rotation    Knee flexion Prone 6/18 4+ Prone 6/18 4  Knee extension 5 4-  Ankle dorsiflexion 4+ 3  Ankle plantarflexion    Ankle inversion    Ankle eversion    (Blank rows = not tested)  BED MOBILITY:  Check next visit  TRANSFERS: Assistive device utilized: None  Sit to stand: Modified independence and SBA Stand to sit: Modified independence Chair to chair: Modified independence Floor:  not tested   STAIRS: Level of Assistance: SBA and CGA Stair Negotiation Technique: Step to Pattern with Bilateral Rails Number of Stairs: 8  Height of  Stairs: 4 to 8  inches  Comments: difficulty with step up leading with left; one step at a time descending  GAIT: Gait pattern: decreased stance time- Left Distance walked: 50 ft in clinic Assistive device utilized: None Level of assistance: Modified independence and SBA Comments: slight decreased stance left leg  FUNCTIONAL TESTS:  5 times sit to stand: 30.00 sec 2 minute walk test: 305 feet no AD SLS   5 sec on right; 1 sec on left  PATIENT SURVEYS:  LEFS 25/80  TODAY'S TREATMENT:                                                                                                                              DATE:  04/11/23 Standing:  heelraises 20X on incline  Tandem stance with head rotation 10X each LE lead  Single leg stance: max of 5 10" bilaterally   4" forward lunge without UE assist 10X2 each  4" forward step ups with power ups 1 UE assist 10X2 each side  4" lateral lunges with eccentric lowering 1 UE 10X2 each side   Side stepping on line 2RT  Tandem gait on line 1RT  Nustep level 2; 5 minutes seat 6 LE only at EOS  04/05/23: Sitting:   Sit to stand  Standing : Side step with green theraband  x 2 RT  Heel raises x 15 Lunge onto 4" step B x 10  Single leg stance:  Rt: 5"  , LT:  0 5 x each  Tandem with head turns  Step up onto 4" step Nustep level 2 x 5'  03/27/23 Seated: LAQ 10X5"  March 10X  Heel/toe raises 10X  Sit to stands no UE 10x 305 feet no AD MMT for remaining hip mm (see above) Goal review Transfer training sit to/from supine, scooting in bed minA cues Supine:  abdominal isometrics 10X5" Bridge 10X  SLR 10X each Sidelying hip abduction 2X10 each Prone: hip extension 2x10 each  03/23/23 physical therapy evaluation and HEP instruction    PATIENT EDUCATION: Education details: Patient educated on exam findings, POC, scope of PT, HEP, and  what to expect next treatment . Person educated: Patient Education method: Explanation, Demonstration, and  Handouts Education comprehension: verbalized understanding, returned demonstration, verbal cues required, and tactile cues required  HOME EXERCISE PROGRAM: Access Code: [facility] URL: [email or web address]  Date: 03/23/2023 Prepared by: AP - Rehab Exercises - Sit to Stand  - 2 x daily - 7 x weekly - 1 sets - 10 reps - Standing Single Leg Stance with Counter Support  - 2 x daily - 7 x weekly - 1 sets - 3 reps - 10 sec hold - Seated Heel Toe Raises  - 2 x daily - 7 x weekly - 1 sets - 10 reps - Seated March  - 2 x daily - 7 x weekly - 1 sets - 10 reps - Seated Long Arc Quad  - 2 x daily -  7 x weekly - 1 sets - 10 reps - 2 sec hold  Date: 03/27/2023 Prepared by: Emeline Gins Exercises - Supine Bridge  - 2 x daily - 7 x weekly - 1 sets - 10 reps - Supine Transversus Abdominis Bracing - Hands on Stomach  - 2 x daily - 7 x weekly - 1 sets - 10 reps - 5 sec hold - Active Straight Leg Raise with Quad Set  - 2 x daily - 7 x weekly - 1 sets - 10 reps - Sidelying Hip Abduction  - 2 x daily - 7 x weekly - 1 sets - 10 reps - Prone Hip Extension  - 2 x daily - 7 x weekly - 1 sets - 10 reps  GOALS: Goals reviewed with patient? Yes  SHORT TERM GOALS: Target date: 04/06/23  patient will be independent with initial HEP  Baseline: Goal status: IN PROGRESS  2.  Patient will self report 30% improvement to improve tolerance for functional activity  Baseline:  Goal status: IN PROGRESS   LONG TERM GOALS: Target date: 04/20/2023  Patient will be independent in self management strategies to improve quality of life and functional outcomes.  Baseline:  Goal status: IN PROGRESS  2.  Patient will self report 50% improvement to improve tolerance for functional activity  Baseline:  Goal status: IN PROGRESS  3.  Patient will increase left leg MMTs to 4+ to 5/5 without pain to promote return to ambulation community distances with minimal deviation.  Baseline: see above Goal status:  IN PROGRESS  4.  Patient will improve 5 times sit to stand score from 30 sec to 20 sec to demonstrate improved functional mobility and increased lower extremity strength.  Baseline:  Goal status: IN PROGRESS  5.  Patient will improve LEFS score by 5 points (30/80) Baseline: 25/80 Goal status: IN PROGRESS  6.  Patient will be able to balance SLS x 10" each leg to demonstrate improved functional balance Baseline:  Goal status: IN PROGRESS  ASSESSMENT:  CLINICAL IMPRESSION:  Continued focus on improving LE strength and stability.  Increased difficulty of step ups with power up and eccentric lowering while reducing UE assist Pt  Therapist advanced pt exercises to WB only.  Pt is completing a walking program as instructed at last session. Pt required only 2 short rest breaks this session.  Patient will benefit from skilled physical therapy services to address these deficits to improve level of function with ADLs, functional mobility tasks, and reduce risk for falls.    OBJECTIVE IMPAIRMENTS: Abnormal gait, decreased activity tolerance, decreased balance, decreased coordination, decreased endurance, difficulty walking, decreased strength, increased fascial restrictions, and impaired perceived functional ability.   ACTIVITY LIMITATIONS: bending, standing, squatting, stairs, bed mobility, locomotion level, and caring for others  PARTICIPATION LIMITATIONS: meal prep, cleaning, laundry, shopping, community activity, and church    REHAB POTENTIAL: Good  CLINICAL DECISION MAKING: Evolving/moderate complexity  EVALUATION COMPLEXITY: Moderate  PLAN:  PT FREQUENCY: 2x/week  PT DURATION: 4 weeks  PLANNED INTERVENTIONS: Therapeutic exercises, Therapeutic activity, Neuromuscular re-education, Balance training, Gait training, Patient/Family education, Joint manipulation, Joint mobilization, Stair training, Orthotic/Fit training, DME instructions, Aquatic Therapy, Dry Needling, Electrical  stimulation, Spinal manipulation, Spinal mobilization, Cryotherapy, Moist heat, Compression bandaging, scar mobilization, Splintting, Taping, Traction, Ultrasound, Ionotophoresis 4mg /ml Dexamethasone, and Manual therapy   PLAN FOR NEXT SESSION: Focus on balance, left leg strengthening.    4:36 PM, 04/11/23  Lurena Nida, PTA/CLT [facility] Ph: [phone]

## 2023-04-18 ENCOUNTER — Ambulatory Visit (HOSPITAL_COMMUNITY): Payer: Medicaid Other | Admitting: Physical Therapy

## 2023-04-19 ENCOUNTER — Ambulatory Visit (HOSPITAL_COMMUNITY): Payer: Medicaid Other

## 2023-04-20 ENCOUNTER — Ambulatory Visit (HOSPITAL_COMMUNITY): Payer: Medicaid Other | Admitting: Physical Therapy

## 2023-04-23 ENCOUNTER — Ambulatory Visit (HOSPITAL_COMMUNITY): Payer: Medicaid Other

## 2023-04-23 ENCOUNTER — Encounter (HOSPITAL_COMMUNITY): Payer: Self-pay

## 2023-04-23 DIAGNOSIS — R262 Difficulty in walking, not elsewhere classified: Secondary | ICD-10-CM | POA: Diagnosis not present

## 2023-04-23 DIAGNOSIS — M6281 Muscle weakness (generalized): Secondary | ICD-10-CM

## 2023-04-23 DIAGNOSIS — R2689 Other abnormalities of gait and mobility: Secondary | ICD-10-CM

## 2023-04-23 NOTE — Therapy (Signed)
OUTPATIENT PHYSICAL THERAPY TREATMENT   Patient Name: Robin Chandler MRN: [id number] DOB:Jun 08, 1961, 62 y.o., female Today's Date: 04/23/2023   PCP: [facility] Next apt (neurologist 05/21/23) [facility]8/[facility]. REFERRING PROVIDER: Narda Bonds, MD  END OF SESSION:   PT End of Session - 04/23/23 0841     Visit Number 5    Number of Visits 8    Date for PT Re-Evaluation 05/21/23    Authorization Type [employer]    Authorization Time Period needs auth after 30 visits    Authorization - Number of Visits 30    PT Start Time [phone]   pt late for apt   PT Stop Time 0857    PT Time Calculation (min) 22 min    Activity Tolerance Patient tolerated treatment well    Behavior During Therapy [facility] for tasks assessed/performed               Past Medical History:  Diagnosis Date   Diabetes mellitus without complication (HCC)    Hypertension    Past Surgical History:  Procedure Laterality Date   CHOLECYSTECTOMY     Patient Active Problem List   Diagnosis Date Noted   Elevated serum creatinine 02/05/2023   DMII (diabetes mellitus, type 2) (HCC) 02/05/2023   HTN (hypertension) 02/05/2023   Tobacco use disorder 02/05/2023   CVA (cerebral vascular accident) (HCC) 02/12/2023    ONSET DATE: 02/12/2023 CVA  REFERRING DIAG: R47.01 (ICD-10-CM) - Aphasia I63.9 (ICD-10-CM) - Cerebrovascular accident (CVA), unspecified mechanism (HCC)  THERAPY DIAG:  Difficulty in walking, not elsewhere classified  Other abnormalities of gait and mobility  Muscle weakness (generalized)  Abnormality of gait due to impairment of balance  Rationale for Evaluation and Treatment: Rehabilitation  SUBJECTIVE:  PT states that her BP has been high, she is not feeling any different.  146/78 pulse 75                                                                                                                                                                                             SUBJECTIVE STATEMENT: Pt late for apt today, stated she over slept then had road work.  Pt stated she feels she has improved by 50% since began therapy.  Reports she has intermittent pain in Rt leg stiff and soreness, pain scale 10/10 that improves with cream.  Reports increased pain with sitting while driving 15 minutes.  Reports she has been walking and completing her exercises 20 minutes a day.  Evaluation: CVA 12-Feb-2023; her sister had passed away; was traveling to [place] from [place]; had to come home to  go to work and then church the next day; felt weak; started having some left side weakness.  Her daughter recognized signs of stroke her mouth drooping on one side.  Went to the hospital; left side is still weak occasionally.  Using nicotine patches to quit smoking.  Now taking Plavix Pt accompanied by: self  PERTINENT HISTORY: DM; sounds like some vertigo symptoms as well.  PAIN:  Are you having pain? No  PRECAUTIONS: Fall  WEIGHT BEARING RESTRICTIONS: No  FALLS: Has patient fallen in last 6 months? Yes. Number of falls 1  had one fall at home with trying to change some clothing out in home  LIVING ENVIRONMENT: Lives with: lives alone and right now living with her brother Lives in: House/apartment Stairs: Yes: External: 5 steps; on right going up, on left going up, and can reach both Has following equipment at home: None  PLOF: Independent  PATIENT GOALS: getting left leg stronger; getting my balance better;go back to work; [name]  OBJECTIVE:   DIAGNOSTIC FINDINGS:  CLINICAL DATA:  Neuro deficit, acute, stroke suspected. Weakness and aphasia upon presentation yesterday.   EXAM: MRI HEAD WITHOUT CONTRAST   TECHNIQUE: Multiplanar, multiecho pulse sequences of the brain and surrounding structures were obtained without intravenous contrast.   COMPARISON:  CT studies 02/04/2023   FINDINGS: Brain: Diffusion imaging shows acute infarction  affecting the right para median pons. No other acute insult. There is an old small vessel infarction in the left side of the pons. There are old small vessel cerebellar infarctions. Cerebral hemispheres show mild chronic small-vessel ischemic changes of the white matter. Old lacunar infarction left caudate head. No large vessel territory stroke. No mass, hemorrhage, hydrocephalus or extra-axial collection.   Vascular: Major vessels at the base of the brain show flow.   Skull and upper cervical spine: Negative   Sinuses/Orbits: Acute inflammatory changes affecting the right maxillary sinus. Orbits negative.   Other: None   IMPRESSION: 1. Acute infarction affecting the right para median pons. No hemorrhage or mass effect. 2. Chronic small-vessel ischemic changes elsewhere throughout the brain as outlined above. 3. Acute right maxillary sinusitis.      COGNITION: Overall cognitive status: Within functional limits for tasks assessed   SENSATION: WFL  COORDINATION: Impaired heel to shin left heel to right shin; normal right heel to left shin  EDEMA:  None noted     LOWER EXTREMITY ROM:     Active  Right Eval Left Eval  Hip flexion    Hip extension    Hip abduction    Hip adduction    Hip internal rotation    Hip external rotation    Knee flexion    Knee extension    Ankle dorsiflexion    Ankle plantarflexion    Ankle inversion    Ankle eversion     (Blank rows = not tested)  LOWER EXTREMITY MMT:    MMT Right Eval Left Eval Right 04/23/23 Left 04/23/23  Hip flexion 4+ 4- 4+ 4/5  Hip extension Prone 6/18 3+ Prone 6/18 3 3+ 3+  Hip abduction Sidelying 6/18 3+ sidelying 6/18 3 4/5 4-/5  Hip adduction      Hip internal rotation      Hip external rotation      Knee flexion Prone 6/18 4+ Prone 6/18 4 4/5 4/5  Knee extension 5 4- 5/5 4+  Ankle dorsiflexion 4+ 3 4+ 3+  Ankle plantarflexion      Ankle inversion  Ankle eversion      (Blank rows =  not tested)  BED MOBILITY:  Check next visit  TRANSFERS: Assistive device utilized: None  Sit to stand: Modified independence and SBA Stand to sit: Modified independence Chair to chair: Modified independence Floor:  not tested   STAIRS: Level of Assistance: SBA and CGA Stair Negotiation Technique: Step to Pattern with Bilateral Rails Number of Stairs: 8  Height of Stairs: 4 to 8 inches  Comments: difficulty with step up leading with left; one step at a time descending  GAIT: Gait pattern: decreased stance time- Left Distance walked: 50 ft in clinic Assistive device utilized: None Level of assistance: Modified independence and SBA Comments: slight decreased stance left leg  FUNCTIONAL TESTS:  5 times sit to stand: 30.00 sec 2 minute walk test: 305 feet no AD SLS   5 sec on right; 1 sec on left  04/23/23:  31ft no AD  5STS 11.74"  PATIENT SURVEYS:  LEFS 25/80  TODAY'S TREATMENT:                                                                                                                              DATE:  04/23/23: : 354ft no AD 5STS 11.74" MMT- see above LEFS- limited by time complete next session Assess balance next session  04/11/23 Standing:  heelraises 20X on incline  Tandem stance with head rotation 10X each LE lead  Single leg stance: max of 5 10" bilaterally   4" forward lunge without UE assist 10X2 each  4" forward step ups with power ups 1 UE assist 10X2 each side  4" lateral lunges with eccentric lowering 1 UE 10X2 each side   Side stepping on line 2RT  Tandem gait on line 1RT  Nustep level 2; 5 minutes seat 6 LE only at EOS  04/05/23: Sitting:   Sit to stand  Standing : Side step with green theraband  x 2 RT  Heel raises x 15 Lunge onto 4" step B x 10  Single leg stance:  Rt: 5"  , LT:  0 5 x each  Tandem with head turns  Step up onto 4" step Nustep level 2 x 5'  03/27/23 Seated: LAQ 10X5"  March 10X  Heel/toe raises 10X  Sit  to stands no UE 10x 305 feet no AD MMT for remaining hip mm (see above) Goal review Transfer training sit to/from supine, scooting in bed minA cues Supine:  abdominal isometrics 10X5" Bridge 10X  SLR 10X each Sidelying hip abduction 2X10 each Prone: hip extension 2x10 each  03/23/23 physical therapy evaluation and HEP instruction    PATIENT EDUCATION: Education details: Patient educated on exam findings, POC, scope of PT, HEP, and  what to expect next treatment . Person educated: Patient Education method: Explanation, Demonstration, and Handouts Education comprehension: verbalized understanding, returned demonstration, verbal cues required, and tactile cues required  HOME EXERCISE PROGRAM: Access Code: [facility] URL:  [email or web address]  Date: 03/23/2023 Prepared by: AP - Rehab Exercises - Sit to Stand  - 2 x daily - 7 x weekly - 1 sets - 10 reps - Standing Single Leg Stance with Counter Support  - 2 x daily - 7 x weekly - 1 sets - 3 reps - 10 sec hold - Seated Heel Toe Raises  - 2 x daily - 7 x weekly - 1 sets - 10 reps - Seated March  - 2 x daily - 7 x weekly - 1 sets - 10 reps - Seated Long Arc Quad  - 2 x daily - 7 x weekly - 1 sets - 10 reps - 2 sec hold  Date: 03/27/2023 Prepared by: Emeline Gins Exercises - Supine Bridge  - 2 x daily - 7 x weekly - 1 sets - 10 reps - Supine Transversus Abdominis Bracing - Hands on Stomach  - 2 x daily - 7 x weekly - 1 sets - 10 reps - 5 sec hold - Active Straight Leg Raise with Quad Set  - 2 x daily - 7 x weekly - 1 sets - 10 reps - Sidelying Hip Abduction  - 2 x daily - 7 x weekly - 1 sets - 10 reps - Prone Hip Extension  - 2 x daily - 7 x weekly - 1 sets - 10 reps  GOALS: Goals reviewed with patient? Yes  SHORT TERM GOALS: Target date: 04/06/23  patient will be independent with initial HEP  Baseline: 04/23/23:  Reports compliance with HEP 5 day a week Goal status: MET  2.  Patient will self report 30%  improvement to improve tolerance for functional activity  Baseline: 04/23/23:  Reports improvements by 50% Goal status: MET   LONG TERM GOALS: Target date: 04/20/2023  Patient will be independent in self management strategies to improve quality of life and functional outcomes.  Baseline:  Goal status: IN PROGRESS  2.  Patient will self report 50% improvement to improve tolerance for functional activity  Baseline: 04/23/23:  Reports improvements by 50% Goal status: MET  3.  Patient will increase left leg MMTs to 4+ to 5/5 without pain to promote return to ambulation community distances with minimal deviation.  Baseline: see above; 04/23/23: Goal status: IN PROGRESS  4.  Patient will improve 5 times sit to stand score from 30 sec to 20 sec to demonstrate improved functional mobility and increased lower extremity strength.  Baseline: 04/23/23:  11.74" Goal status: IN PROGRESS  5.  Patient will improve LEFS score by 5 points (30/80) Baseline: 25/80; 04/23/23:  complete next session. Goal status: IN PROGRESS  6.  Patient will be able to balance SLS x 10" each leg to demonstrate improved functional balance Baseline: 04/23/23:  assess next session.   Goal status: IN PROGRESS  ASSESSMENT:  CLINICAL IMPRESSION:  Pt late for apt, unable to complete full POC today.  Reviewed goals this session.  Pt reports she feels 50% improvements.  Objective testing complete with increased distance noted during and ability to complete 5STS in 11.74" indicating improvements.  Some improvements noted with strength though does continue to present with weakness in gluteal mm.  Limited by time so unable to complete LEFS or assess balance this session.  Pt will continues to benefit from skilled physical therapy services to address deficits including strength, balance and functional skills to reduce risk of falls.    OBJECTIVE IMPAIRMENTS: Abnormal gait, decreased activity tolerance, decreased balance,  decreased coordination, decreased endurance, difficulty  walking, decreased strength, increased fascial restrictions, and impaired perceived functional ability.   ACTIVITY LIMITATIONS: bending, standing, squatting, stairs, bed mobility, locomotion level, and caring for others  PARTICIPATION LIMITATIONS: meal prep, cleaning, laundry, shopping, community activity, and church    REHAB POTENTIAL: Good  CLINICAL DECISION MAKING: Evolving/moderate complexity  EVALUATION COMPLEXITY: Moderate  PLAN:  PT FREQUENCY: 2x/week  PT DURATION: 4 weeks  PLANNED INTERVENTIONS: Therapeutic exercises, Therapeutic activity, Neuromuscular re-education, Balance training, Gait training, Patient/Family education, Joint manipulation, Joint mobilization, Stair training, Orthotic/Fit training, DME instructions, Aquatic Therapy, Dry Needling, Electrical stimulation, Spinal manipulation, Spinal mobilization, Cryotherapy, Moist heat, Compression bandaging, scar mobilization, Splintting, Taping, Traction, Ultrasound, Ionotophoresis 4mg /ml Dexamethasone, and Manual therapy   PLAN FOR NEXT SESSION: Focus on balance, left leg strengthening.   Becky Sax, LPTA/CLT; CBIS [phone]  Juel Burrow, PTA 04/23/2023, 1:55 PM  1:55 PM, 04/23/23

## 2023-04-24 NOTE — Addendum Note (Signed)
Addended byWilhemena Durie S on: 04/24/2023 02:59 PM   Modules accepted: Orders

## 2023-04-25 ENCOUNTER — Ambulatory Visit (HOSPITAL_COMMUNITY): Payer: Medicaid Other

## 2023-04-25 DIAGNOSIS — R2689 Other abnormalities of gait and mobility: Secondary | ICD-10-CM

## 2023-04-25 DIAGNOSIS — M6281 Muscle weakness (generalized): Secondary | ICD-10-CM

## 2023-04-25 DIAGNOSIS — R262 Difficulty in walking, not elsewhere classified: Secondary | ICD-10-CM | POA: Diagnosis not present

## 2023-04-25 DIAGNOSIS — I639 Cerebral infarction, unspecified: Secondary | ICD-10-CM

## 2023-04-25 NOTE — Therapy (Addendum)
OUTPATIENT PHYSICAL THERAPY TREATMENT   Patient Name: Robin Chandler MRN: [id number] DOB:[birth year], 62 y.o., female Today's Date: 05/04/2023   PCP: [facility] Next apt (neurologist 05/21/23) [facility]. REFERRING PROVIDER: Narda Bonds, MD  END OF SESSION:       Past Medical History:  Diagnosis Date   Diabetes mellitus without complication (HCC)    Hypertension    Past Surgical History:  Procedure Laterality Date   CHOLECYSTECTOMY     Patient Active Problem List   Diagnosis Date Noted   Elevated serum creatinine 02/05/2023   DMII (diabetes mellitus, type 2) (HCC) 02/05/2023   HTN (hypertension) 02/05/2023   Tobacco use disorder 02/05/2023   CVA (cerebral vascular accident) (HCC) 2023/02/09    ONSET DATE: 09-Feb-2023 CVA  REFERRING DIAG: R47.01 (ICD-10-CM) - Aphasia I63.9 (ICD-10-CM) - Cerebrovascular accident (CVA), unspecified mechanism (HCC)  THERAPY DIAG:  Difficulty in walking, not elsewhere classified  Other abnormalities of gait and mobility  Muscle weakness (generalized)  Abnormality of gait due to impairment of balance  Cerebrovascular accident (CVA), unspecified mechanism (HCC)  Rationale for Evaluation and Treatment: Rehabilitation  SUBJECTIVE:  PT states that her BP has been high, she is not feeling any different.  146/78 pulse 75                                                                                                                                                                                            SUBJECTIVE STATEMENT: No new pain; MD gave her a new blood pressure medication  Evaluation: CVA 02/09/23; her sister had passed away; was traveling to and from [place]; had to come home to go to work and then church the next day; felt weak; started having some left side weakness.  Her daughter recognized signs of stroke her mouth drooping on one side.  Went to the hospital; left side is  still weak occasionally.  Using nicotine patches to quit smoking.  Now taking Plavix Pt accompanied by: self  PERTINENT HISTORY: DM; sounds like some vertigo symptoms as well.  PAIN:  Are you having pain? No  PRECAUTIONS: Fall  WEIGHT BEARING RESTRICTIONS: No  FALLS: Has patient fallen in last 6 months? Yes. Number of falls 1  had one fall at home with trying to change some clothing out in home  LIVING ENVIRONMENT: Lives with: lives alone and right now living with her brother Lives in: House/apartment Stairs: Yes: External: 5 steps; on right going up, on left going up, and can reach both Has following equipment at home: None  PLOF: Independent  PATIENT GOALS: getting left leg stronger; getting my  balance better;go back to work; [name]  OBJECTIVE:   DIAGNOSTIC FINDINGS:  CLINICAL DATA:  Neuro deficit, acute, stroke suspected. Weakness and aphasia upon presentation yesterday.   EXAM: MRI HEAD WITHOUT CONTRAST   TECHNIQUE: Multiplanar, multiecho pulse sequences of the brain and surrounding structures were obtained without intravenous contrast.   COMPARISON:  CT studies 02/04/2023   FINDINGS: Brain: Diffusion imaging shows acute infarction affecting the right para median pons. No other acute insult. There is an old small vessel infarction in the left side of the pons. There are old small vessel cerebellar infarctions. Cerebral hemispheres show mild chronic small-vessel ischemic changes of the white matter. Old lacunar infarction left caudate head. No large vessel territory stroke. No mass, hemorrhage, hydrocephalus or extra-axial collection.   Vascular: Major vessels at the base of the brain show flow.   Skull and upper cervical spine: Negative   Sinuses/Orbits: Acute inflammatory changes affecting the right maxillary sinus. Orbits negative.   Other: None   IMPRESSION: 1. Acute infarction affecting the right para median pons. No hemorrhage or mass  effect. 2. Chronic small-vessel ischemic changes elsewhere throughout the brain as outlined above. 3. Acute right maxillary sinusitis.      COGNITION: Overall cognitive status: Within functional limits for tasks assessed   SENSATION: WFL  COORDINATION: Impaired heel to shin left heel to right shin; normal right heel to left shin  EDEMA:  None noted     LOWER EXTREMITY ROM:     Active  Right Eval Left Eval  Hip flexion    Hip extension    Hip abduction    Hip adduction    Hip internal rotation    Hip external rotation    Knee flexion    Knee extension    Ankle dorsiflexion    Ankle plantarflexion    Ankle inversion    Ankle eversion     (Blank rows = not tested)  LOWER EXTREMITY MMT:    MMT Right Eval Left Eval Right 04/23/23 Left 04/23/23  Hip flexion 4+ 4- 4+ 4/5  Hip extension Prone 6/18 3+ Prone 6/18 3 3+ 3+  Hip abduction Sidelying 6/18 3+ sidelying 6/18 3 4/5 4-/5  Hip adduction      Hip internal rotation      Hip external rotation      Knee flexion Prone 6/18 4+ Prone 6/18 4 4/5 4/5  Knee extension 5 4- 5/5 4+  Ankle dorsiflexion 4+ 3 4+ 3+  Ankle plantarflexion      Ankle inversion      Ankle eversion      (Blank rows = not tested)  BED MOBILITY:  Check next visit  TRANSFERS: Assistive device utilized: None  Sit to stand: Modified independence and SBA Stand to sit: Modified independence Chair to chair: Modified independence Floor:  not tested   STAIRS: Level of Assistance: SBA and CGA Stair Negotiation Technique: Step to Pattern with Bilateral Rails Number of Stairs: 8  Height of Stairs: 4 to 8 inches  Comments: difficulty with step up leading with left; one step at a time descending  GAIT: Gait pattern: decreased stance time- Left Distance walked: 50 ft in clinic Assistive device utilized: None Level of assistance: Modified independence and SBA Comments: slight decreased stance left leg  FUNCTIONAL TESTS:  5 times sit  to stand: 30.00 sec 2 minute walk test: 305 feet no AD SLS   5 sec on right; 1 sec on left  04/23/23:  3106ft no AD  5STS 11.74"  PATIENT SURVEYS:  LEFS 25/80  TODAY'S TREATMENT:                                                                                                                              DATE:  04/25/23 Nustep seat 6 x 5 min Standing: 6" box step ups x 10 each 6" box lateral step ups x 10 each Heel raises on incline x 20 Tandem stance 2 x 30" each Sit to stand x 10 no UE assist  Sitting blood pressure 132/64 left arm in sitting  LEFS 38/80    04/23/23: : 35ft no AD 5STS 11.74" MMT- see above LEFS- limited by time complete next session Assess balance next session  04/11/23 Standing:  heelraises 20X on incline  Tandem stance with head rotation 10X each LE lead  Single leg stance: max of 5 10" bilaterally   4" forward lunge without UE assist 10X2 each  4" forward step ups with power ups 1 UE assist 10X2 each side  4" lateral lunges with eccentric lowering 1 UE 10X2 each side   Side stepping on line 2RT  Tandem gait on line 1RT  Nustep level 2; 5 minutes seat 6 LE only at EOS  04/05/23: Sitting:   Sit to stand  Standing : Side step with green theraband  x 2 RT  Heel raises x 15 Lunge onto 4" step B x 10  Single leg stance:  Rt: 5"  , LT:  0 5 x each  Tandem with head turns  Step up onto 4" step Nustep level 2 x 5'  03/27/23 Seated: LAQ 10X5"  March 10X  Heel/toe raises 10X  Sit to stands no UE 10x 305 feet no AD MMT for remaining hip mm (see above) Goal review Transfer training sit to/from supine, scooting in bed minA cues Supine:  abdominal isometrics 10X5" Bridge 10X  SLR 10X each Sidelying hip abduction 2X10 each Prone: hip extension 2x10 each  03/23/23 physical therapy evaluation and HEP instruction    PATIENT EDUCATION: Education details: Patient educated on exam findings, POC, scope of PT, HEP, and  what to expect next  treatment . Person educated: Patient Education method: Explanation, Demonstration, and Handouts Education comprehension: verbalized understanding, returned demonstration, verbal cues required, and tactile cues required  HOME EXERCISE PROGRAM: Access Code: [facility] URL: [email or web address]  Date: 03/23/2023 Prepared by: AP - Rehab Exercises - Sit to Stand  - 2 x daily - 7 x weekly - 1 sets - 10 reps - Standing Single Leg Stance with Counter Support  - 2 x daily - 7 x weekly - 1 sets - 3 reps - 10 sec hold - Seated Heel Toe Raises  - 2 x daily - 7 x weekly - 1 sets - 10 reps - Seated March  - 2 x daily - 7 x weekly - 1 sets - 10 reps - Seated Long Arc Quad  - 2 x daily -  7 x weekly - 1 sets - 10 reps - 2 sec hold  Date: 03/27/2023 Prepared by: Emeline Gins Exercises - Supine Bridge  - 2 x daily - 7 x weekly - 1 sets - 10 reps - Supine Transversus Abdominis Bracing - Hands on Stomach  - 2 x daily - 7 x weekly - 1 sets - 10 reps - 5 sec hold - Active Straight Leg Raise with Quad Set  - 2 x daily - 7 x weekly - 1 sets - 10 reps - Sidelying Hip Abduction  - 2 x daily - 7 x weekly - 1 sets - 10 reps - Prone Hip Extension  - 2 x daily - 7 x weekly - 1 sets - 10 reps  GOALS: Goals reviewed with patient? Yes  SHORT TERM GOALS: Target date: 04/06/23  patient will be independent with initial HEP  Baseline: 04/23/23:  Reports compliance with HEP 5 day a week Goal status: MET  2.  Patient will self report 30% improvement to improve tolerance for functional activity  Baseline: 04/23/23:  Reports improvements by 50% Goal status: MET   LONG TERM GOALS: Target date: 04/20/2023  Patient will be independent in self management strategies to improve quality of life and functional outcomes.  Baseline:  Goal status: IN PROGRESS  2.  Patient will self report 50% improvement to improve tolerance for functional activity  Baseline: 04/23/23:  Reports improvements by 50% Goal  status: MET  3.  Patient will increase left leg MMTs to 4+ to 5/5 without pain to promote return to ambulation community distances with minimal deviation.  Baseline: see above; 04/23/23: Goal status: IN PROGRESS  4.  Patient will improve 5 times sit to stand score from 30 sec to 20 sec to demonstrate improved functional mobility and increased lower extremity strength.  Baseline: 04/23/23:  11.74" Goal status: IN PROGRESS  5.  Patient will improve LEFS score by 5 points (30/80) Baseline: 25/80; 04/23/23:  complete next session.; 04/25/23 38/80  Goal status: MET  6.  Patient will be able to balance SLS x 10" each leg to demonstrate improved functional balance Baseline: 04/23/23:  assess next session.   Goal status: IN PROGRESS  ASSESSMENT:  CLINICAL IMPRESSION:  BP in normal range today; completed LEFS with score of 38/80.  Pt will continues to benefit from skilled physical therapy services to address deficits including strength, balance and functional skills to reduce risk of falls.    OBJECTIVE IMPAIRMENTS: Abnormal gait, decreased activity tolerance, decreased balance, decreased coordination, decreased endurance, difficulty walking, decreased strength, increased fascial restrictions, and impaired perceived functional ability.   ACTIVITY LIMITATIONS: bending, standing, squatting, stairs, bed mobility, locomotion level, and caring for others  PARTICIPATION LIMITATIONS: meal prep, cleaning, laundry, shopping, community activity, and church    REHAB POTENTIAL: Good  CLINICAL DECISION MAKING: Evolving/moderate complexity  EVALUATION COMPLEXITY: Moderate  PLAN:  PT FREQUENCY: 2x/week  PT DURATION: 4 weeks  PLANNED INTERVENTIONS: Therapeutic exercises, Therapeutic activity, Neuromuscular re-education, Balance training, Gait training, Patient/Family education, Joint manipulation, Joint mobilization, Stair training, Orthotic/Fit training, DME instructions, Aquatic Therapy, Dry  Needling, Electrical stimulation, Spinal manipulation, Spinal mobilization, Cryotherapy, Moist heat, Compression bandaging, scar mobilization, Splintting, Taping, Traction, Ultrasound, Ionotophoresis 4mg /ml Dexamethasone, and Manual therapy   PLAN FOR NEXT SESSION: Focus on balance, left leg strengthening.  Work on squatting, reaching objects in the floor  7:31 AM, 05/04/23 Alp Goldwater Small Stepen Prins MPT [facility] [place] [phone]

## 2023-05-03 ENCOUNTER — Encounter (INDEPENDENT_AMBULATORY_CARE_PROVIDER_SITE_OTHER): Payer: Self-pay | Admitting: *Deleted

## 2023-05-03 ENCOUNTER — Encounter (HOSPITAL_COMMUNITY): Payer: Self-pay

## 2023-05-03 ENCOUNTER — Ambulatory Visit (HOSPITAL_COMMUNITY): Payer: Medicaid Other

## 2023-05-03 DIAGNOSIS — R2689 Other abnormalities of gait and mobility: Secondary | ICD-10-CM

## 2023-05-03 DIAGNOSIS — M6281 Muscle weakness (generalized): Secondary | ICD-10-CM

## 2023-05-03 DIAGNOSIS — R262 Difficulty in walking, not elsewhere classified: Secondary | ICD-10-CM | POA: Diagnosis not present

## 2023-05-03 NOTE — Therapy (Signed)
OUTPATIENT PHYSICAL THERAPY TREATMENT   Patient Name: Robin Chandler MRN: [id number] DOB:1960-12-16, 62 y.o., female Today's Date: 05/03/2023   PCP: [facility] Next apt (neurologist 05/21/23) [facility]. REFERRING PROVIDER: Narda Bonds, MD  END OF SESSION:   PT End of Session - 05/03/23 1541     Visit Number 7    Number of Visits 8    Date for PT Re-Evaluation 05/21/23    Authorization Type [employer]    Authorization Time Period needs auth after 30 visits    Authorization - Visit Number 7    Authorization - Number of Visits 30    PT Start Time 1541    PT Stop Time 1623    PT Time Calculation (min) 42 min    Activity Tolerance Patient tolerated treatment well    Behavior During Therapy WFL for tasks assessed/performed               Past Medical History:  Diagnosis Date   Diabetes mellitus without complication (HCC)    Hypertension    Past Surgical History:  Procedure Laterality Date   CHOLECYSTECTOMY     Patient Active Problem List   Diagnosis Date Noted   Elevated serum creatinine 02/05/2023   DMII (diabetes mellitus, type 2) (HCC) 02/05/2023   HTN (hypertension) 02/05/2023   Tobacco use disorder 02/05/2023   CVA (cerebral vascular accident) (HCC) Feb 22, 2023    ONSET DATE: 02-22-2023 CVA  REFERRING DIAG: R47.01 (ICD-10-CM) - Aphasia I63.9 (ICD-10-CM) - Cerebrovascular accident (CVA), unspecified mechanism (HCC)  THERAPY DIAG:  Difficulty in walking, not elsewhere classified  Other abnormalities of gait and mobility  Muscle weakness (generalized)  Abnormality of gait due to impairment of balance  Rationale for Evaluation and Treatment: Rehabilitation  SUBJECTIVE:  PT states that her BP has been high, she is not feeling any different.  146/78 pulse 75 Pt reports ability to complete house hold activities, did laundry earlier today.  Stated Lt foot drags on occasion.                                                                                                                                                                                             SUBJECTIVE STATEMENT: No new pain; MD gave her a new blood pressure medication  Neurologist: 8/7; PCP 05/30/23  Evaluation: CVA 02/22/23; her sister had passed away; was traveling to and from [place]; had to come home to go to work and then church the next day; felt weak; started having some left side weakness.  Her daughter recognized signs of stroke her mouth drooping on one side.  Went to the hospital; left side is still weak occasionally.  Using nicotine patches to quit smoking.  Now taking Plavix Pt accompanied by: self  PERTINENT HISTORY: DM; sounds like some vertigo symptoms as well.  PAIN:  Are you having pain? No  PRECAUTIONS: Fall  WEIGHT BEARING RESTRICTIONS: No  FALLS: Has patient fallen in last 6 months? Yes. Number of falls 1  had one fall at home with trying to change some clothing out in home  LIVING ENVIRONMENT: Lives with: lives alone and right now living with her brother Lives in: House/apartment Stairs: Yes: External: 5 steps; on right going up, on left going up, and can reach both Has following equipment at home: None  PLOF: Independent  PATIENT GOALS: getting left leg stronger; getting my balance better;go back to work; [name]  OBJECTIVE:   DIAGNOSTIC FINDINGS:  CLINICAL DATA:  Neuro deficit, acute, stroke suspected. Weakness and aphasia upon presentation yesterday.   EXAM: MRI HEAD WITHOUT CONTRAST   TECHNIQUE: Multiplanar, multiecho pulse sequences of the brain and surrounding structures were obtained without intravenous contrast.   COMPARISON:  CT studies 02/04/2023   FINDINGS: Brain: Diffusion imaging shows acute infarction affecting the right para median pons. No other acute insult. There is an old small vessel infarction in the left side of the pons. There are old  small vessel cerebellar infarctions. Cerebral hemispheres show mild chronic small-vessel ischemic changes of the white matter. Old lacunar infarction left caudate head. No large vessel territory stroke. No mass, hemorrhage, hydrocephalus or extra-axial collection.   Vascular: Major vessels at the base of the brain show flow.   Skull and upper cervical spine: Negative   Sinuses/Orbits: Acute inflammatory changes affecting the right maxillary sinus. Orbits negative.   Other: None   IMPRESSION: 1. Acute infarction affecting the right para median pons. No hemorrhage or mass effect. 2. Chronic small-vessel ischemic changes elsewhere throughout the brain as outlined above. 3. Acute right maxillary sinusitis.      COGNITION: Overall cognitive status: Within functional limits for tasks assessed   SENSATION: WFL  COORDINATION: Impaired heel to shin left heel to right shin; normal right heel to left shin  EDEMA:  None noted     LOWER EXTREMITY ROM:     Active  Right Eval Left Eval  Hip flexion    Hip extension    Hip abduction    Hip adduction    Hip internal rotation    Hip external rotation    Knee flexion    Knee extension    Ankle dorsiflexion    Ankle plantarflexion    Ankle inversion    Ankle eversion     (Blank rows = not tested)  LOWER EXTREMITY MMT:    MMT Right Eval Left Eval Right 04/23/23 Left 04/23/23  Hip flexion 4+ 4- 4+ 4/5  Hip extension Prone 6/18 3+ Prone 6/18 3 3+ 3+  Hip abduction Sidelying 6/18 3+ sidelying 6/18 3 4/5 4-/5  Hip adduction      Hip internal rotation      Hip external rotation      Knee flexion Prone 6/18 4+ Prone 6/18 4 4/5 4/5  Knee extension 5 4- 5/5 4+  Ankle dorsiflexion 4+ 3 4+ 3+  Ankle plantarflexion      Ankle inversion      Ankle eversion      (Blank rows = not tested)  BED MOBILITY:  Check next visit  TRANSFERS: Assistive device utilized: None  Sit to  stand: Modified independence and  SBA Stand to sit: Modified independence Chair to chair: Modified independence Floor:  not tested   STAIRS: Level of Assistance: SBA and CGA Stair Negotiation Technique: Step to Pattern with Bilateral Rails Number of Stairs: 8  Height of Stairs: 4 to 8 inches  Comments: difficulty with step up leading with left; one step at a time descending  GAIT: Gait pattern: decreased stance time- Left Distance walked: 50 ft in clinic Assistive device utilized: None Level of assistance: Modified independence and SBA Comments: slight decreased stance left leg  FUNCTIONAL TESTS:  5 times sit to stand: 30.00 sec 2 minute walk test: 305 feet no AD SLS   5 sec on right; 1 sec on left  04/23/23:  359ft no AD  5STS 11.74"  PATIENT SURVEYS:  LEFS 25/80  TODAY'S TREATMENT:                                                                                                                              DATE:  05/03/23: Vitals: 137/69 mmHg, HR 82 bpm Standing:   Squat to heel raise 15 no HHA, front of chair  Picking up pen 10x squatting  Sidestep RTB around thigh 2RT  Tandem stance 2x 30" 1x 30" on foam  Forward reaching 10x  Lumbar extension 10x  Cone rotation on foam NBOS  Lunge onto 4in step 15x no HHA  Vector stance 3x 5"  04/25/23 Nustep seat 6 x 5 min Standing: 6" box step ups x 10 each 6" box lateral step ups x 10 each Heel raises on incline x 20 Tandem stance 2 x 30" each Sit to stand x 10 no UE assist  Sitting blood pressure 132/64 left arm in sitting  LEFS 38/80    04/23/23: : 351ft no AD 5STS 11.74" MMT- see above LEFS- limited by time complete next session Assess balance next session  04/11/23 Standing:  heelraises 20X on incline  Tandem stance with head rotation 10X each LE lead  Single leg stance: max of 5 10" bilaterally   4" forward lunge without UE assist 10X2 each  4" forward step ups with power ups 1 UE assist 10X2 each side  4" lateral lunges with  eccentric lowering 1 UE 10X2 each side   Side stepping on line 2RT  Tandem gait on line 1RT  Nustep level 2; 5 minutes seat 6 LE only at EOS  04/05/23: Sitting:   Sit to stand  Standing : Side step with green theraband  x 2 RT  Heel raises x 15 Lunge onto 4" step B x 10  Single leg stance:  Rt: 5"  , LT:  0 5 x each  Tandem with head turns  Step up onto 4" step Nustep level 2 x 5'  03/27/23 Seated: LAQ 10X5"  March 10X  Heel/toe raises 10X  Sit to stands no UE 10x 305 feet no AD MMT for remaining hip  mm (see above) Goal review Transfer training sit to/from supine, scooting in bed minA cues Supine:  abdominal isometrics 10X5" Bridge 10X  SLR 10X each Sidelying hip abduction 2X10 each Prone: hip extension 2x10 each  03/23/23 physical therapy evaluation and HEP instruction    PATIENT EDUCATION: Education details: Patient educated on exam findings, POC, scope of PT, HEP, and  what to expect next treatment . Person educated: Patient Education method: Explanation, Demonstration, and Handouts Education comprehension: verbalized understanding, returned demonstration, verbal cues required, and tactile cues required  HOME EXERCISE PROGRAM: Access Code: [facility] URL: [email or web address]  Date: 03/23/2023 Prepared by: AP - Rehab Exercises - Sit to Stand  - 2 x daily - 7 x weekly - 1 sets - 10 reps - Standing Single Leg Stance with Counter Support  - 2 x daily - 7 x weekly - 1 sets - 3 reps - 10 sec hold - Seated Heel Toe Raises  - 2 x daily - 7 x weekly - 1 sets - 10 reps - Seated March  - 2 x daily - 7 x weekly - 1 sets - 10 reps - Seated Long Arc Quad  - 2 x daily - 7 x weekly - 1 sets - 10 reps - 2 sec hold  Date: 03/27/2023 Prepared by: Emeline Gins Exercises - Supine Bridge  - 2 x daily - 7 x weekly - 1 sets - 10 reps - Supine Transversus Abdominis Bracing - Hands on Stomach  - 2 x daily - 7 x weekly - 1 sets - 10 reps - 5 sec hold - Active  Straight Leg Raise with Quad Set  - 2 x daily - 7 x weekly - 1 sets - 10 reps - Sidelying Hip Abduction  - 2 x daily - 7 x weekly - 1 sets - 10 reps - Prone Hip Extension  - 2 x daily - 7 x weekly - 1 sets - 10 reps  GOALS: Goals reviewed with patient? Yes  SHORT TERM GOALS: Target date: 04/06/23  patient will be independent with initial HEP  Baseline: 04/23/23:  Reports compliance with HEP 5 day a week Goal status: MET  2.  Patient will self report 30% improvement to improve tolerance for functional activity  Baseline: 04/23/23:  Reports improvements by 50% Goal status: MET   LONG TERM GOALS: Target date: 04/20/2023  Patient will be independent in self management strategies to improve quality of life and functional outcomes.  Baseline:  Goal status: IN PROGRESS  2.  Patient will self report 50% improvement to improve tolerance for functional activity  Baseline: 04/23/23:  Reports improvements by 50% Goal status: MET  3.  Patient will increase left leg MMTs to 4+ to 5/5 without pain to promote return to ambulation community distances with minimal deviation.  Baseline: see above; 04/23/23: Goal status: IN PROGRESS  4.  Patient will improve 5 times sit to stand score from 30 sec to 20 sec to demonstrate improved functional mobility and increased lower extremity strength.  Baseline: 04/23/23:  11.74" Goal status: IN PROGRESS  5.  Patient will improve LEFS score by 5 points (30/80) Baseline: 25/80; 04/23/23:  complete next session.; 04/25/23 38/80  Goal status: MET  6.  Patient will be able to balance SLS x 10" each leg to demonstrate improved functional balance Baseline: 04/23/23:  assess next session.   Goal status: IN PROGRESS  ASSESSMENT:  CLINICAL IMPRESSION:  Pt late for apt, unable to complete full POC today.  Reviewed goals  this session.  Pt reports she feels 50% improvements.  Objective testing complete with increased distance noted during and ability to complete  5STS in 11.74" indicating improvements.  Some improvements noted with strength though does continue to present with weakness in gluteal mm.  Limited by time so unable to complete LEFS or assess balance this session.  Pt will continues to benefit from skilled physical therapy services to address deficits including strength, balance and functional skills to reduce risk of falls.   Session focus with balance and functional strengthening.  Added reaching and lifting activities that was challenging but tolerated well.  Reviewed current HEP with additional balance exercises added and given RTB.  Instructed proper mechanics with squats, pt able to pick up item from floor safely.     OBJECTIVE IMPAIRMENTS: Abnormal gait, decreased activity tolerance, decreased balance, decreased coordination, decreased endurance, difficulty walking, decreased strength, increased fascial restrictions, and impaired perceived functional ability.   ACTIVITY LIMITATIONS: bending, standing, squatting, stairs, bed mobility, locomotion level, and caring for others  PARTICIPATION LIMITATIONS: meal prep, cleaning, laundry, shopping, community activity, and church    REHAB POTENTIAL: Good  CLINICAL DECISION MAKING: Evolving/moderate complexity  EVALUATION COMPLEXITY: Moderate  PLAN:  PT FREQUENCY: 2x/week  PT DURATION: 4 weeks  PLANNED INTERVENTIONS: Therapeutic exercises, Therapeutic activity, Neuromuscular re-education, Balance training, Gait training, Patient/Family education, Joint manipulation, Joint mobilization, Stair training, Orthotic/Fit training, DME instructions, Aquatic Therapy, Dry Needling, Electrical stimulation, Spinal manipulation, Spinal mobilization, Cryotherapy, Moist heat, Compression bandaging, scar mobilization, Splintting, Taping, Traction, Ultrasound, Ionotophoresis 4mg /ml Dexamethasone, and Manual therapy   PLAN FOR NEXT SESSION: Focus on balance, left leg strengthening.  Work on squatting,  reaching objects in the floor.   Reassess next session.   Becky Sax, LPTA/CLT; CBIS [phone]  Juel Burrow, PTA 05/03/2023, 4:35 PM  4:35 PM, 05/03/23

## 2023-05-10 ENCOUNTER — Ambulatory Visit (HOSPITAL_COMMUNITY): Payer: Medicaid Other | Attending: Family Medicine

## 2023-05-10 ENCOUNTER — Encounter (HOSPITAL_COMMUNITY): Payer: Self-pay

## 2023-05-10 DIAGNOSIS — R2689 Other abnormalities of gait and mobility: Secondary | ICD-10-CM | POA: Insufficient documentation

## 2023-05-10 DIAGNOSIS — M6281 Muscle weakness (generalized): Secondary | ICD-10-CM | POA: Diagnosis present

## 2023-05-10 DIAGNOSIS — R262 Difficulty in walking, not elsewhere classified: Secondary | ICD-10-CM | POA: Insufficient documentation

## 2023-05-10 DIAGNOSIS — I639 Cerebral infarction, unspecified: Secondary | ICD-10-CM | POA: Insufficient documentation

## 2023-05-10 NOTE — Therapy (Signed)
OUTPATIENT PHYSICAL THERAPY TREATMENT  Progress Note Reporting Period 03/23/23 to 05/10/23  See note below for Objective Data and Assessment of Progress/Goals.     Patient Name: Robin Chandler MRN: [id number] DOB:03/16/1961, 62 y.o., female Today's Date: 05/10/2023   PCP: [facility] Next apt (neurologist 05/21/23) [facility] Center8/21/24. REFERRING PROVIDER: Narda Bonds, MD  END OF SESSION:   PT End of Session - 05/10/23 1133     Visit Number 8    Number of Visits 8    Date for PT Re-Evaluation 05/21/23    Authorization Type [employer] Medicaid    Authorization Time Period needs auth after 30 visits    Authorization - Visit Number 8    Authorization - Number of Visits 30    PT Start Time 1134    PT Stop Time 1212    PT Time Calculation (min) 38 min    Activity Tolerance Patient tolerated treatment well    Behavior During Therapy WFL for tasks assessed/performed               Past Medical History:  Diagnosis Date   Diabetes mellitus without complication (HCC)    Hypertension    Past Surgical History:  Procedure Laterality Date   CHOLECYSTECTOMY     Patient Active Problem List   Diagnosis Date Noted   Elevated serum creatinine 02/05/2023   DMII (diabetes mellitus, type 2) (HCC) 02/05/2023   HTN (hypertension) 02/05/2023   Tobacco use disorder 02/05/2023   CVA (cerebral vascular accident) (HCC) 02/09/23    ONSET DATE: 02-09-23 CVA  REFERRING DIAG: R47.01 (ICD-10-CM) - Aphasia I63.9 (ICD-10-CM) - Cerebrovascular accident (CVA), unspecified mechanism (HCC)  THERAPY DIAG:  Difficulty in walking, not elsewhere classified  Other abnormalities of gait and mobility  Muscle weakness (generalized)  Abnormality of gait due to impairment of balance  Cerebrovascular accident (CVA), unspecified mechanism (HCC)  Rationale for Evaluation and Treatment: Rehabilitation                     SUBJECTIVE STATEMENT: 05/10/23:  Feels  she has improved by 60%.  No falls.  Has been compliant with HEP daily.  Feels ready to RTW.  Neurologist: 8/7; PCP 05/30/23  Evaluation: CVA Feb 09, 2023; her sister had passed away; was traveling to and from [place]; had to come home to go to work and then church the next day; felt weak; started having some left side weakness.  Her daughter recognized signs of stroke her mouth drooping on one side.  Went to the hospital; left side is still weak occasionally.  Using nicotine patches to quit smoking.  Now taking Plavix Pt accompanied by: self  PERTINENT HISTORY: DM; sounds like some vertigo symptoms as well.  PAIN:  Are you having pain? No  PRECAUTIONS: Fall  WEIGHT BEARING RESTRICTIONS: No  FALLS: Has patient fallen in last 6 months? Yes. Number of falls 1  had one fall at home with trying to change some clothing out in home  LIVING ENVIRONMENT: Lives with: lives alone and right now living with her brother Lives in: House/apartment Stairs: Yes: External: 5 steps; on right going up, on left going up, and can reach both Has following equipment at home: None  PLOF: Independent  PATIENT GOALS: getting left leg stronger; getting my balance better;go back to work; [name]  OBJECTIVE:   DIAGNOSTIC FINDINGS:  CLINICAL DATA:  Neuro deficit, acute, stroke suspected. Weakness and aphasia upon presentation yesterday.   EXAM: MRI HEAD  WITHOUT CONTRAST   TECHNIQUE: Multiplanar, multiecho pulse sequences of the brain and surrounding structures were obtained without intravenous contrast.   COMPARISON:  CT studies 02/04/2023   FINDINGS: Brain: Diffusion imaging shows acute infarction affecting the right para median pons. No other acute insult. There is an old small vessel infarction in the left side of the pons. There are old small vessel cerebellar infarctions. Cerebral hemispheres show mild chronic small-vessel ischemic changes of the white matter. Old lacunar infarction  left caudate head. No large vessel territory stroke. No mass, hemorrhage, hydrocephalus or extra-axial collection.   Vascular: Major vessels at the base of the brain show flow.   Skull and upper cervical spine: Negative   Sinuses/Orbits: Acute inflammatory changes affecting the right maxillary sinus. Orbits negative.   Other: None   IMPRESSION: 1. Acute infarction affecting the right para median pons. No hemorrhage or mass effect. 2. Chronic small-vessel ischemic changes elsewhere throughout the brain as outlined above. 3. Acute right maxillary sinusitis.      COGNITION: Overall cognitive status: Within functional limits for tasks assessed   SENSATION: WFL  COORDINATION: Impaired heel to shin left heel to right shin; normal right heel to left shin  EDEMA:  None noted     LOWER EXTREMITY ROM:     Active  Right Eval Left Eval  Hip flexion    Hip extension    Hip abduction    Hip adduction    Hip internal rotation    Hip external rotation    Knee flexion    Knee extension    Ankle dorsiflexion    Ankle plantarflexion    Ankle inversion    Ankle eversion     (Blank rows = not tested)  LOWER EXTREMITY MMT:    MMT Right Eval Left Eval Right 04/23/23 Left 04/23/23 Right 05/10/23 Left  05/10/23  Hip flexion 4+ 4- 4+ 4/5 5/5 4+/5  Hip extension Prone 6/18 3+ Prone 6/18 3 3+ 3+ 4/5 4/5  Hip abduction Sidelying 6/18 3+ sidelying 6/18 3 4/5 4-/5 5/5 4+/5   Hip adduction        Hip internal rotation        Hip external rotation        Knee flexion Prone 6/18 4+ Prone 6/18 4 4/5 4/5 4+ 4/5  Knee extension 5 4- 5/5 4+ 5/5 5/5  Ankle dorsiflexion 4+ 3 4+ 3+ 5/5 5/5  Ankle plantarflexion        Ankle inversion        Ankle eversion        (Blank rows = not tested)  BED MOBILITY:  Check next visit  TRANSFERS: Assistive device utilized: None  Sit to stand: Modified independence and SBA Stand to sit: Modified independence Chair to chair: Modified  independence Floor:  not tested   STAIRS: Level of Assistance: SBA and CGA Stair Negotiation Technique: Step to Pattern with Bilateral Rails Number of Stairs: 8  Height of Stairs: 4 to 8 inches  Comments: difficulty with step up leading with left; one step at a time descending  GAIT: Gait pattern: decreased stance time- Left Distance walked: 50 ft in clinic Assistive device utilized: None Level of assistance: Modified independence and SBA Comments: slight decreased stance left leg  FUNCTIONAL TESTS:  5 times sit to stand: 30.00 sec 2 minute walk test: 305 feet no AD SLS   5 sec on right; 1 sec on left  04/23/23:  342ft no AD  5STS 11.74" 05/10/23:  5STS 10.43"  426ft no AD  SLS Rt 26", Lt 20"  LEFS 48/80    PATIENT SURVEYS:  LEFS 25/80  TODAY'S TREATMENT:                                                                                                                              DATE:  05/10/23: 5STS 10.43" MMT see above LEFS 48/80 SLS Rt 26", LT 20" 473ft no AD Reviewed goals, discussed importance of HEP compliance following therapy  05/03/23: Vitals: 137/69 mmHg, HR 82 bpm Standing:   Squat to heel raise 15 no HHA, front of chair  Picking up pen 10x squatting  Sidestep RTB around thigh 2RT  Tandem stance 2x 30" 1x 30" on foam  Forward reaching 10x  Lumbar extension 10x  Cone rotation on foam NBOS  Lunge onto 4in step 15x no HHA  Vector stance 3x 5"  04/25/23 Nustep seat 6 x 5 min Standing: 6" box step ups x 10 each 6" box lateral step ups x 10 each Heel raises on incline x 20 Tandem stance 2 x 30" each Sit to stand x 10 no UE assist  Sitting blood pressure 132/64 left arm in sitting  LEFS 38/80    04/23/23: : 355ft no AD 5STS 11.74" MMT- see above LEFS- limited by time complete next session Assess balance next session  04/11/23 Standing:  heelraises 20X on incline  Tandem stance with head rotation 10X each LE lead  Single leg  stance: max of 5 10" bilaterally   4" forward lunge without UE assist 10X2 each  4" forward step ups with power ups 1 UE assist 10X2 each side  4" lateral lunges with eccentric lowering 1 UE 10X2 each side   Side stepping on line 2RT  Tandem gait on line 1RT  Nustep level 2; 5 minutes seat 6 LE only at EOS  04/05/23: Sitting:   Sit to stand  Standing : Side step with green theraband  x 2 RT  Heel raises x 15 Lunge onto 4" step B x 10  Single leg stance:  Rt: 5"  , LT:  0 5 x each  Tandem with head turns  Step up onto 4" step Nustep level 2 x 5'  03/27/23 Seated: LAQ 10X5"  March 10X  Heel/toe raises 10X  Sit to stands no UE 10x 305 feet no AD MMT for remaining hip mm (see above) Goal review Transfer training sit to/from supine, scooting in bed minA cues Supine:  abdominal isometrics 10X5" Bridge 10X  SLR 10X each Sidelying hip abduction 2X10 each Prone: hip extension 2x10 each  03/23/23 physical therapy evaluation and HEP instruction    PATIENT EDUCATION: Education details: Patient educated on exam findings, POC, scope of PT, HEP, and  what to expect next treatment . Person educated: Patient Education method: Explanation, Demonstration, and Handouts Education comprehension: verbalized understanding, returned demonstration, verbal cues required, and tactile cues required  HOME EXERCISE PROGRAM: Access Code: [email or web address] URL: [email or web address]  Date: 03/23/2023 Prepared by: AP - Rehab Exercises - Sit to Stand  - 2 x daily - 7 x weekly - 1 sets - 10 reps - Standing Single Leg Stance with Counter Support  - 2 x daily - 7 x weekly - 1 sets - 3 reps - 10 sec hold - Seated Heel Toe Raises  - 2 x daily - 7 x weekly - 1 sets - 10 reps - Seated March  - 2 x daily - 7 x weekly - 1 sets - 10 reps - Seated Long Arc Quad  - 2 x daily - 7 x weekly - 1 sets - 10 reps - 2 sec hold  Date: 03/27/2023 Prepared by: Emeline Gins Exercises - Supine Bridge  - 2 x  daily - 7 x weekly - 1 sets - 10 reps - Supine Transversus Abdominis Bracing - Hands on Stomach  - 2 x daily - 7 x weekly - 1 sets - 10 reps - 5 sec hold - Active Straight Leg Raise with Quad Set  - 2 x daily - 7 x weekly - 1 sets - 10 reps - Sidelying Hip Abduction  - 2 x daily - 7 x weekly - 1 sets - 10 reps - Prone Hip Extension  - 2 x daily - 7 x weekly - 1 sets - 10 reps  GOALS: Goals reviewed with patient? Yes  SHORT TERM GOALS: Target date: 04/06/23  patient will be independent with initial HEP  Baseline: 04/23/23:  Reports compliance with HEP 5 day a week Goal status: MET  2.  Patient will self report 30% improvement to improve tolerance for functional activity  Baseline: 04/23/23:  Reports improvements by 50% Goal status: MET   LONG TERM GOALS: Target date: 04/20/2023  Patient will be independent in self management strategies to improve quality of life and functional outcomes.  Baseline:  Goal status: MET  2.  Patient will self report 50% improvement to improve tolerance for functional activity  Baseline: 04/23/23:  Reports improvements by 50%; 05/10/23:  Feels she has improved 60% since  Goal status: MET  3.  Patient will increase left leg MMTs to 4+ to 5/5 without pain to promote return to ambulation community distances with minimal deviation.  Baseline: see above; 04/23/23: MET except for glut max Goal status: MET  4.  Patient will improve 5 times sit to stand score from 30 sec to 20 sec to demonstrate improved functional mobility and increased lower extremity strength.  Baseline: 04/23/23:  11.74"; 05/10/23: 10343% no HHA Goal status: MET  5.  Patient will improve LEFS score by 5 points (30/80) Baseline: 25/80; 04/23/23:  complete next session.; 04/25/23 38/80  Goal status: MET  6.  Patient will be able to balance SLS x 10" each leg to demonstrate improved functional balance Baseline: 04/23/23:  assess next session.  05/10/23: Rt 26", Lt 20" Goal status:  MET  ASSESSMENT:  CLINICAL IMPRESSION:  Progress note with the following findings:  Pt has met 2/2 STGs and 6/6 LTGs.  Pt presents with improved self perceived functional abilities noted with LEFS score improved to 48/80 (was 25/80 initial eval) and feels she has improved by 60-70%.  Presents with faster STS and with cadence during .  Strength has improved though continues to have some glut max weakness (4/5), discussed exercises to continue at home to address this weakness.  Pt able to SLS for 20"+ BLE and no reports  of recent falls.  Pt to be DC to HEP.    OBJECTIVE IMPAIRMENTS: Abnormal gait, decreased activity tolerance, decreased balance, decreased coordination, decreased endurance, difficulty walking, decreased strength, increased fascial restrictions, and impaired perceived functional ability.   ACTIVITY LIMITATIONS: bending, standing, squatting, stairs, bed mobility, locomotion level, and caring for others  PARTICIPATION LIMITATIONS: meal prep, cleaning, laundry, shopping, community activity, and church    REHAB POTENTIAL: Good  CLINICAL DECISION MAKING: Evolving/moderate complexity  EVALUATION COMPLEXITY: Moderate  PLAN:  PT FREQUENCY: 2x/week  PT DURATION: 4 weeks  PLANNED INTERVENTIONS: Therapeutic exercises, Therapeutic activity, Neuromuscular re-education, Balance training, Gait training, Patient/Family education, Joint manipulation, Joint mobilization, Stair training, Orthotic/Fit training, DME instructions, Aquatic Therapy, Dry Needling, Electrical stimulation, Spinal manipulation, Spinal mobilization, Cryotherapy, Moist heat, Compression bandaging, scar mobilization, Splintting, Taping, Traction, Ultrasound, Ionotophoresis 4mg /ml Dexamethasone, and Manual therapy   PLAN FOR NEXT SESSION: DC to HEP.   Becky Sax, LPTA/CLT; CBIS [phone]  Juel Burrow, PTA 05/10/2023, 12:38 PM  12:38 PM, 05/10/23

## 2023-05-12 ENCOUNTER — Encounter (HOSPITAL_COMMUNITY): Payer: Self-pay

## 2023-05-12 NOTE — Therapy (Signed)
[facility] Outpatient Rehabilitation at [facility] [address] [place], [place], [address] Phone: [phone]   Fax:  [phone]  Patient Details  Name: Robin Chandler MRN: [id number] Date of Birth: 1961-07-21 Referring Provider:  No ref. provider found  PHYSICAL THERAPY DISCHARGE SUMMARY  Visits from Start of Care: 8  Current functional level related to goals / functional outcomes: All goals met   Remaining deficits: All goals met   Education / Equipment: HEP   Patient agrees to discharge. Patient goals were met. Patient is being discharged due to meeting the stated rehab goals.  11:31 AM, 05/12/23  [facility]  MPT [place] [place] [phone]    [facility] Outpatient Rehabilitation at [facility] [address] [place], [place], [address] Phone: [phone]   Fax:  [phone]

## 2023-05-16 ENCOUNTER — Encounter: Payer: Self-pay | Admitting: Neurology

## 2023-05-16 ENCOUNTER — Ambulatory Visit: Payer: Medicaid Other | Admitting: Neurology

## 2023-05-16 VITALS — BP 100/68 | HR 85 | Ht 64.0 in | Wt 154.6 lb

## 2023-05-16 DIAGNOSIS — I6350 Cerebral infarction due to unspecified occlusion or stenosis of unspecified cerebral artery: Secondary | ICD-10-CM | POA: Diagnosis not present

## 2023-05-16 DIAGNOSIS — I6381 Other cerebral infarction due to occlusion or stenosis of small artery: Secondary | ICD-10-CM | POA: Diagnosis not present

## 2023-05-16 NOTE — Progress Notes (Signed)
[facility] [address] [place]. [place] [address] [phone]       OFFICE CONSULT NOTE  [name] Date of Birth:  Oct 03, 1961 Medical Record Number:  [id number]   Referring MD: Jacquelin Hawking  Reason for Referral: Stroke  HPI: [name] is a 62 year old African-American lady seen today for initial office consultation visit for stroke.  She is accompanied by her daughter.  History is obtained from them and review of electronic medical records.  I personally reviewed pertinent available imaging films in PACS.  She has past medical history of diabetes hypertension and hyperlipidemia.  She presented on 02/01/2023 to [facility] with subacute symptoms of facial droop slurred speech and left-sided weakness for the last couple of days.  Symptoms began 2 days prior patient did not seek emergent help as she felt she did not notice the symptoms.  Next day the family noticed some facial droop and slurred speech and brought her to the hospital for evaluation.  CT head on admission showed no acute abnormality with concern for right cerebellar stroke.  MRI however confirmed a right paramedian pontine acute lacunar infarct as well as remote to his left pontine lacunar infarct and questionable right cerebellar remote infarct as well.  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  There is mild stenosis of left vertebral artery in the V1 segment.  Echocardiogram showed ejection fraction of 60 to 65% with mild left atrial dilatation.  Hemoglobin A1c was elevated at 9.1 and LDL cholesterol was 80 mg percent.  Triglycerides were elevated at 419.  Patient was started on aspirin Plavix for 3 weeks followed by Plavix alone and Lipitor 40 mg daily.  She states she has done well.  Slurred speech and facial weakness and left leg weakness have improved.  She still feels that the left leg drags at times particularly when  she is tired.  She has been discharged from outpatient physical occupational therapy last week and has done well.  She still complains of some dizziness and lightheadedness and feels this may be related to the blood pressure medicines.  She has had no recurrent stroke or TIA symptoms.  She does have an upcoming appointment to see primary care physician on 05/30/2023.  She claims she had some recent lab work done but I am privy of those results today.  She denies any prior history of known strokes TIAs seizures significant neurological problems.  ROS:   14 system review of systems is positive for slurred speech, facial weakness, leg weakness, dizziness, lightheadedness and all other systems negative  PMH:  Past Medical History:  Diagnosis Date   Diabetes mellitus without complication (HCC)    Hypertension     Social History:  Social History   Socioeconomic History   Marital status: Legally Separated    Spouse name: Not on file   Number of children: Not on file   Years of education: Not on file   Highest education level: Not on file  Occupational History   Not on file  Tobacco Use   Smoking status: Every Day   Smokeless tobacco: Never  Substance and Sexual Activity   Alcohol use: Never   Drug use: Never   Sexual activity: Not on file  Other Topics Concern   Not on file  Social History Narrative   Not on file   Social Determinants of Health   Financial Resource Strain: Not on file  Food  Insecurity: No Food Insecurity (02/04/2023)   Hunger Vital Sign    Worried About Running Out of Food in the Last Year: Never true    Ran Out of Food in the Last Year: Never true  Transportation Needs: No Transportation Needs (02/04/2023)   PRAPARE - Administrator, Civil Service (Medical): No    Lack of Transportation (Non-Medical): No  Physical Activity: Not on file  Stress: Not on file  Social Connections: Not on file  Intimate Partner Violence: Not At Risk (02/04/2023)    Humiliation, Afraid, Rape, and Kick questionnaire    Fear of Current or Ex-Partner: No    Emotionally Abused: No    Physically Abused: No    Sexually Abused: No    Medications:   Current Outpatient Medications on File Prior to Visit  Medication Sig Dispense Refill   atorvastatin (LIPITOR) 40 MG tablet Take 40 mg by mouth daily.     busPIRone (BUSPAR) 7.5 MG tablet Take 7.5 mg by mouth daily.     cyclobenzaprine (FLEXERIL) 10 MG tablet Take 10 mg by mouth at bedtime.     fluticasone (FLONASE) 50 MCG/ACT nasal spray Place 2 sprays into both nostrils daily.     hydrOXYzine (ATARAX/VISTARIL) 25 MG tablet Take 25-50 mg by mouth every 6 (six) hours as needed. For anxiety     Insulin Glargine (BASAGLAR KWIKPEN) 100 UNIT/ML SOPN Inject 20 Units into the skin at bedtime.      JANUMET 50-1000 MG tablet Take 1 tablet by mouth 2 (two) times daily.     JARDIANCE 25 MG TABS tablet Take 25 mg by mouth daily.     lisinopril (ZESTRIL) 20 MG tablet Take 20 mg by mouth daily.     traZODone (DESYREL) 50 MG tablet Take 50 mg by mouth at bedtime.     vitamin C (ASCORBIC ACID) 500 MG tablet Take 500 mg by mouth daily.     pantoprazole (PROTONIX) 40 MG tablet Take 1 tablet (40 mg total) by mouth daily. 30 tablet 2   No current facility-administered medications on file prior to visit.    Allergies:  No Known Allergies  Physical Exam General: well developed, well nourished, seated, in no evident distress Head: head normocephalic and atraumatic.   Neck: supple with no carotid or supraclavicular bruits Cardiovascular: regular rate and rhythm, no murmurs Musculoskeletal: no deformity Skin:  no rash/petichiae Vascular:  Normal pulses all extremities  Neurologic Exam Mental Status: Awake and fully alert. Oriented to place and time. Recent and remote memory intact. Attention span, concentration and fund of knowledge appropriate. Mood and affect appropriate.  Cranial Nerves: Fundoscopic exam reveals sharp disc  margins. Pupils equal, briskly reactive to light. Extraocular movements full without nystagmus. Visual fields full to confrontation. Hearing intact. Facial sensation intact.  Mild left lower facial asymmetry.  Tongue, palate moves normally and symmetrically.  Motor: Normal bulk and tone. Normal strength in all tested extremity muscles. Sensory.: intact to touch , pinprick , position and vibratory sensation.  Coordination: Rapid alternating movements normal in all extremities. Finger-to-nose and heel-to-shin performed accurately bilaterally. Gait and Station: Arises from chair without difficulty. Stance is normal. Gait demonstrates normal stride length and balance with slight dragging of the left leg..  Not able to heel, toe and tandem walk without difficulty.  Reflexes: 1+ and symmetric. Toes downgoing.   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  Multiple vascular risk factors of diabetes, hypertension, hyperlipidemia, obesity , intracranial stenosis, silent strokes and smoking.     PLAN: I had a long d/w patient and her daughter about her recent pontine lacunar stroke, risk for recurrent stroke/TIAs, personally independently reviewed imaging studies and stroke evaluation results and answered questions.Continue Plavix 75 mg daily for secondary stroke prevention and maintain strict control of hypertension with blood pressure goal below 130/90, diabetes with hemoglobin A1c goal below 6.5% and lipids with LDL cholesterol goal below 70 mg/dL. I also advised the patient to eat a healthy diet with plenty of whole grains, cereals, fruits and vegetables, exercise regularly and maintain ideal body weight .check follow-up lipid profile and hemoglobin A1c.  Patient is neurologically cleared to return to work without restrictions starting 06/04/2023 followup in the future with my nurse  practitioner in 3 months or call earlier if necessary.  I also counseled the patient to quit smoking and she states she will try.  Greater than 50% time during this 45-minute consultation visit was spent in counseling and coordination of care about her lacunar stroke and discussion with stroke evaluation and prevention and treatment and answering questions.  Delia Heady, MD Note: This document was prepared with digital dictation and possible smart phrase technology. Any transcriptional errors that result from this process are unintentional.

## 2023-05-16 NOTE — Patient Instructions (Signed)
I had a long d/w patient and her daughter about her recent pontine lacunar stroke, risk for recurrent stroke/TIAs, personally independently reviewed imaging studies and stroke evaluation results and answered questions.Continue Plavix 75 mg daily for secondary stroke prevention and maintain strict control of hypertension with blood pressure goal below 130/90, diabetes with hemoglobin A1c goal below 6.5% and lipids with LDL cholesterol goal below 70 mg/dL. I also advised the patient to eat a healthy diet with plenty of whole grains, cereals, fruits and vegetables, exercise regularly and maintain ideal body weight .check follow-up lipid profile and hemoglobin A1c.  Patient is neurologically cleared to return to work without restrictions starting 06/04/2023 followup in the future with my nurse practitioner in 3 months or call earlier if necessary. Stroke Prevention Some medical conditions and behaviors can lead to a higher chance of having a stroke. You can help prevent a stroke by eating healthy, exercising, not smoking, and managing any medical conditions you have. Stroke is a leading cause of functional impairment. Primary prevention is particularly important because a majority of strokes are first-time events. Stroke changes the lives of not only those who experience a stroke but also their family and other caregivers. How can this condition affect me? A stroke is a medical emergency and should be treated right away. A stroke can lead to brain damage and can sometimes be life-threatening. If a person gets medical treatment right away, there is a better chance of surviving and recovering from a stroke. What can increase my risk? The following medical conditions may increase your risk of a stroke: Cardiovascular disease. High blood pressure (hypertension). Diabetes. High cholesterol. Sickle cell disease. Blood clotting disorders (hypercoagulable state). Obesity. Sleep disorders (obstructive sleep  apnea). Other risk factors include: Being older than age 63. Having a history of blood clots, stroke, or mini-stroke (transient ischemic attack, TIA). Genetic factors, such as race, ethnicity, or a family history of stroke. Smoking cigarettes or using other tobacco products. Taking birth control pills, especially if you also use tobacco. Heavy use of alcohol or drugs, especially cocaine and methamphetamine. Physical inactivity. What actions can I take to prevent this? Manage your health conditions High cholesterol levels. Eating a healthy diet is important for preventing high cholesterol. If cholesterol cannot be managed through diet alone, you may need to take medicines. Take any prescribed medicines to control your cholesterol as told by your health care provider. Hypertension. To reduce your risk of stroke, try to keep your blood pressure below 130/80. Eating a healthy diet and exercising regularly are important for controlling blood pressure. If these steps are not enough to manage your blood pressure, you may need to take medicines. Take any prescribed medicines to control hypertension as told by your health care provider. Ask your health care provider if you should monitor your blood pressure at home. Have your blood pressure checked every year, even if your blood pressure is normal. Blood pressure increases with age and some medical conditions. Diabetes. Eating a healthy diet and exercising regularly are important parts of managing your blood sugar (glucose). If your blood sugar cannot be managed through diet and exercise, you may need to take medicines. Take any prescribed medicines to control your diabetes as told by your health care provider. Get evaluated for obstructive sleep apnea. Talk to your health care provider about getting a sleep evaluation if you snore a lot or have excessive sleepiness. Make sure that any other medical conditions you have, such as atrial fibrillation or  atherosclerosis, are  managed. Nutrition Follow instructions from your health care provider about what to eat or drink to help manage your health condition. These instructions may include: Reducing your daily calorie intake. Limiting how much salt (sodium) you use to 1,500 milligrams (mg) each day. Using only healthy fats for cooking, such as olive oil, canola oil, or sunflower oil. Eating healthy foods. You can do this by: Choosing foods that are high in fiber, such as whole grains, and fresh fruits and vegetables. Eating at least 5 servings of fruits and vegetables a day. Try to fill one-half of your plate with fruits and vegetables at each meal. Choosing lean protein foods, such as lean cuts of meat, poultry without skin, fish, tofu, beans, and nuts. Eating low-fat dairy products. Avoiding foods that are high in sodium. This can help lower blood pressure. Avoiding foods that have saturated fat, trans fat, and cholesterol. This can help prevent high cholesterol. Avoiding processed and prepared foods. Counting your daily carbohydrate intake.  Lifestyle If you drink alcohol: Limit how much you have to: 0-1 drink a day for women who are not pregnant. 0-2 drinks a day for men. Know how much alcohol is in your drink. In the [place] one drink equals one 12 oz bottle of beer ( ), one 5 oz glass of wine ( ), or one 1 oz glass of hard liquor (44mL). Do not use any products that contain nicotine or tobacco. These products include cigarettes, chewing tobacco, and vaping devices, such as e-cigarettes. If you need help quitting, ask your health care provider. Avoid secondhand smoke. Do not use drugs. Activity  Try to stay at a healthy weight. Get at least 30 minutes of exercise on most days, such as: Fast walking. Biking. Swimming. Medicines Take over-the-counter and prescription medicines only as told by your health care provider. Aspirin or blood thinners (antiplatelets or  anticoagulants) may be recommended to reduce your risk of forming blood clots that can lead to stroke. Avoid taking birth control pills. Talk to your health care provider about the risks of taking birth control pills if: You are over 49 years old. You smoke. You get very bad headaches. You have had a blood clot. Where to find more information American Stroke Association: www.strokeassociation.org Get help right away if: You or a loved one has any symptoms of a stroke. "BE FAST" is an easy way to remember the main warning signs of a stroke: B - Balance. Signs are dizziness, sudden trouble walking, or loss of balance. E - Eyes. Signs are trouble seeing or a sudden change in vision. F - Face. Signs are sudden weakness or numbness of the face, or the face or eyelid drooping on one side. A - Arms. Signs are weakness or numbness in an arm. This happens suddenly and usually on one side of the body. S - Speech. Signs are sudden trouble speaking, slurred speech, or trouble understanding what people say. T - Time. Time to call emergency services. Write down what time symptoms started. You or a loved one has other signs of a stroke, such as: A sudden, severe headache with no known cause. Nausea or vomiting. Seizure. These symptoms may represent a serious problem that is an emergency. Do not wait to see if the symptoms will go away. Get medical help right away. Call your local emergency services (911 in the [place] Do not drive yourself to the hospital. Summary You can help to prevent a stroke by eating healthy, exercising, not smoking, limiting alcohol intake, and managing  any medical conditions you may have. Do not use any products that contain nicotine or tobacco. These include cigarettes, chewing tobacco, and vaping devices, such as e-cigarettes. If you need help quitting, ask your health care provider. Remember "BE FAST" for warning signs of a stroke. Get help right away if you or a loved one has any  of these signs. This information is not intended to replace advice given to you by your health care provider. Make sure you discuss any questions you have with your health care provider. Document Revised: 08/28/2022 Document Reviewed: 08/28/2022 Elsevier Patient Education  2024 [employer].

## 2023-05-17 ENCOUNTER — Ambulatory Visit (HOSPITAL_COMMUNITY): Payer: Medicaid Other

## 2023-05-20 NOTE — Progress Notes (Signed)
Kindly advise the patient that cholesterol profile is mostly satisfactory but triglycerides are elevated and to see primary care physician for advice on how to manage this. Screening test for diabetes is also high though improved from before and to see primary care physician to manage diabetes more aggressively.

## 2023-05-21 ENCOUNTER — Telehealth: Payer: Self-pay | Admitting: Anesthesiology

## 2023-05-21 NOTE — Telephone Encounter (Signed)
-----   Message from Delia Heady sent at 05/20/2023  3:55 PM EDT ----- Joneen Roach advise the patient that cholesterol profile is mostly satisfactory but triglycerides are elevated and to see primary care physician for advice on how to manage this. Screening test for diabetes is also high though improved from before and to see primary care physician to manage diabetes more aggressively.

## 2023-05-21 NOTE — Telephone Encounter (Signed)
Called pt and informed of lab results. Per Dr Pearlean Brownie: cholesterol profile is mostly satisfactory but triglycerides are elevated and screening test for diabetes is also high though improved from before, to see primary care physician to manage diabetes more aggressively and manage elevated triglycerides as well. Pt verbalized understanding. Pt had no additional questions at this time but was encouraged to call back if questions arise.

## 2023-05-22 ENCOUNTER — Ambulatory Visit (HOSPITAL_COMMUNITY): Payer: Medicaid Other

## 2023-05-24 ENCOUNTER — Ambulatory Visit (HOSPITAL_COMMUNITY): Payer: Medicaid Other

## 2023-07-23 ENCOUNTER — Emergency Department (HOSPITAL_COMMUNITY): Payer: Medicaid Other

## 2023-07-23 ENCOUNTER — Observation Stay (HOSPITAL_COMMUNITY)
Admission: EM | Admit: 2023-07-23 | Discharge: 2023-07-24 | Disposition: A | Discharge status: Home / Self Care | Payer: Medicaid Other | Attending: Internal Medicine | Admitting: Internal Medicine

## 2023-07-23 ENCOUNTER — Other Ambulatory Visit: Payer: Self-pay

## 2023-07-23 DIAGNOSIS — E119 Type 2 diabetes mellitus without complications: Secondary | ICD-10-CM | POA: Diagnosis not present

## 2023-07-23 DIAGNOSIS — I10 Essential (primary) hypertension: Secondary | ICD-10-CM | POA: Diagnosis present

## 2023-07-23 DIAGNOSIS — I350 Nonrheumatic aortic (valve) stenosis: Secondary | ICD-10-CM

## 2023-07-23 DIAGNOSIS — Z794 Long term (current) use of insulin: Secondary | ICD-10-CM | POA: Diagnosis not present

## 2023-07-23 DIAGNOSIS — I639 Cerebral infarction, unspecified: Principal | ICD-10-CM | POA: Diagnosis present

## 2023-07-23 DIAGNOSIS — Z79899 Other long term (current) drug therapy: Secondary | ICD-10-CM | POA: Diagnosis not present

## 2023-07-23 DIAGNOSIS — R29818 Other symptoms and signs involving the nervous system: Secondary | ICD-10-CM | POA: Diagnosis present

## 2023-07-23 DIAGNOSIS — F17200 Nicotine dependence, unspecified, uncomplicated: Secondary | ICD-10-CM | POA: Diagnosis present

## 2023-07-23 DIAGNOSIS — F419 Anxiety disorder, unspecified: Secondary | ICD-10-CM

## 2023-07-23 DIAGNOSIS — R4781 Slurred speech: Secondary | ICD-10-CM | POA: Diagnosis present

## 2023-07-23 LAB — URINALYSIS, ROUTINE W REFLEX MICROSCOPIC
Bilirubin Urine: NEGATIVE
Glucose, UA: >=500 mg/dL — AB
Hgb urine dipstick: NEGATIVE
Ketones, ur: NEGATIVE mg/dL
Nitrite: NEGATIVE
Protein, ur: 30 mg/dL — AB
Specific Gravity, Urine: 1.024 (ref 1.005–1.030)
pH: 7 (ref 5.0–8.0)

## 2023-07-23 LAB — CBC
HCT: 35.6 % — ABNORMAL LOW (ref 36.0–46.0)
Hemoglobin: 11.4 g/dL — ABNORMAL LOW (ref 12.0–15.0)
MCH: 28.4 pg (ref 26.0–34.0)
MCHC: 32 g/dL (ref 30.0–36.0)
MCV: 88.6 fL (ref 80.0–100.0)
Platelets: 337 10*3/uL (ref 150–400)
RBC: 4.02 MIL/uL (ref 3.87–5.11)
RDW: 14 % (ref 11.5–15.5)
WBC: 6.7 10*3/uL (ref 4.0–10.5)
nRBC: 0 % (ref 0.0–0.2)

## 2023-07-23 LAB — APTT: aPTT: 30 s (ref 24–36)

## 2023-07-23 LAB — COMPREHENSIVE METABOLIC PANEL
ALT: 13 U/L (ref 0–44)
AST: 12 U/L — ABNORMAL LOW (ref 15–41)
Albumin: 4.2 g/dL (ref 3.5–5.0)
Alkaline Phosphatase: 70 U/L (ref 38–126)
Anion gap: 8 (ref 5–15)
BUN: 24 mg/dL — ABNORMAL HIGH (ref 8–23)
CO2: 25 mmol/L (ref 22–32)
Calcium: 8.9 mg/dL (ref 8.9–10.3)
Chloride: 104 mmol/L (ref 98–111)
Creatinine, Ser: 1.16 mg/dL — ABNORMAL HIGH (ref 0.44–1.00)
GFR, Estimated: 53 mL/min — ABNORMAL LOW (ref >60)
Glucose, Bld: 119 mg/dL — ABNORMAL HIGH (ref 70–99)
Potassium: 4.3 mmol/L (ref 3.5–5.1)
Sodium: 137 mmol/L (ref 135–145)
Total Bilirubin: 0.4 mg/dL (ref 0.3–1.2)
Total Protein: 7.4 g/dL (ref 6.5–8.1)

## 2023-07-23 LAB — CBG MONITORING, ED: Glucose-Capillary: 102 mg/dL — ABNORMAL HIGH (ref 70–99)

## 2023-07-23 LAB — DIFFERENTIAL
Abs Immature Granulocytes: 0.01 10*3/uL (ref 0.00–0.07)
Basophils Absolute: 0 10*3/uL (ref 0.0–0.1)
Basophils Relative: 0 %
Eosinophils Absolute: 0.1 10*3/uL (ref 0.0–0.5)
Eosinophils Relative: 2 %
Immature Granulocytes: 0 %
Lymphocytes Relative: 39 %
Lymphs Abs: 2.6 10*3/uL (ref 0.7–4.0)
Monocytes Absolute: 0.4 10*3/uL (ref 0.1–1.0)
Monocytes Relative: 6 %
Neutro Abs: 3.6 10*3/uL (ref 1.7–7.7)
Neutrophils Relative %: 53 %

## 2023-07-23 LAB — ETHANOL: Alcohol, Ethyl (B): <10 mg/dL (ref <10)

## 2023-07-23 LAB — PROTIME-INR
INR: 1 (ref 0.8–1.2)
Prothrombin Time: 13.3 s (ref 11.4–15.2)

## 2023-07-23 MED ORDER — IOHEXOL 350 MG/ML SOLN
100.0000 mL | Freq: Once | INTRAVENOUS | Status: AC | PRN
  Administered 2023-07-23: 100 mL via INTRAVENOUS

## 2023-07-23 MED ORDER — ASPIRIN 81 MG PO TBEC
81.0000 mg | DELAYED_RELEASE_TABLET | Freq: Every day | ORAL | Status: DC
  Administered 2023-07-24: 81 mg via ORAL
  Filled 2023-07-23: qty 1

## 2023-07-23 MED ORDER — CLOPIDOGREL BISULFATE 75 MG PO TABS
300.0000 mg | ORAL_TABLET | Freq: Once | ORAL | Status: AC
  Administered 2023-07-24: 300 mg via ORAL
  Filled 2023-07-23: qty 4

## 2023-07-23 MED ORDER — CLOPIDOGREL BISULFATE 75 MG PO TABS
75.0000 mg | ORAL_TABLET | Freq: Every day | ORAL | Status: DC
  Administered 2023-07-24: 75 mg via ORAL
  Filled 2023-07-23: qty 1

## 2023-07-23 NOTE — ED Provider Notes (Signed)
[place] [facility]GENC[facility] DEPARTMENT AT [facility] Provider Note   CSN: [id number] Arrival date & time: 07/23/23  1951  An emergency department physician performed an initial assessment on this suspected stroke patient at 2000.  History  Chief Complaint  Patient presents with   Code Stroke    Robin Chandler is a 62 y.o. female.  Patient came in as a potential code stroke.  Patient felt kind of dizzy at 2 in the afternoon laid down awoke again at 430 at that time had left-sided weakness and slurred speech.  Patient was seen April for an actual stroke with similar symptoms.  Patient states that is really the left leg that is weak.  And when she got here there was left leg weakness and still slurred speech.  Patient had code stroke activation.  Past medical history significant for diabetes and hypertension gallbladder removal.  Patient was admitted April 28 for visual field change and weakness aphasia.  For that visit patient was admitted April 28 discharged April 29.  With neurology follow-up they felt that it was a cerebrovascular accident.  The workup at that time was significant for an acute infarct involving the right paramedian pons neurology consult that time recommending dual antiplatelet therapy.  And physical therapy and Occupational Therapy.  Patient was brought in by family member.  Patient's blood pressure here was 141/63 temp 98 pulse 75 respirations 18 oxygen saturation is 100%       Home Medications Prior to Admission medications   Medication Sig Start Date End Date Taking? Authorizing Provider  atorvastatin (LIPITOR) 40 MG tablet Take 40 mg by mouth daily. 12/14/22   [provider]  busPIRone (BUSPAR) 7.5 MG tablet Take 7.5 mg by mouth daily. 06/25/19   [provider]  cyclobenzaprine (FLEXERIL) 10 MG tablet Take 10 mg by mouth at bedtime. 01/15/23   [provider]  fluticasone (FLONASE) 50 MCG/ACT nasal spray Place 2 sprays into both nostrils  daily. 05/07/23   [provider]  hydrOXYzine (ATARAX/VISTARIL) 25 MG tablet Take 25-50 mg by mouth every 6 (six) hours as needed. For anxiety 05/30/19   [provider]  Insulin Glargine (BASAGLAR KWIKPEN) 100 UNIT/ML SOPN Inject 20 Units into the skin at bedtime.  06/14/19   [provider]  JANUMET 50-1000 MG tablet Take 1 tablet by mouth 2 (two) times daily. 06/24/19   [provider]  JARDIANCE 25 MG TABS tablet Take 25 mg by mouth daily. 12/14/22   [provider]  lisinopril (ZESTRIL) 20 MG tablet Take 20 mg by mouth daily. 06/13/19   [provider]  pantoprazole (PROTONIX) 40 MG tablet Take 1 tablet (40 mg total) by mouth daily. 02/05/23 05/06/23  Narda Bonds, MD  traZODone (DESYREL) 50 MG tablet Take 50 mg by mouth at bedtime. 01/15/23   [provider]  vitamin C (ASCORBIC ACID) 500 MG tablet Take 500 mg by mouth daily.    [provider]      Allergies    Patient has no known allergies.    Review of Systems   Review of Systems  Constitutional:  Negative for chills and fever.  HENT:  Negative for ear pain and sore throat.   Eyes:  Negative for pain and visual disturbance.  Respiratory:  Negative for cough and shortness of breath.   Cardiovascular:  Negative for chest pain and palpitations.  Gastrointestinal:  Negative for abdominal pain and vomiting.  Genitourinary:  Negative for dysuria and hematuria.  Musculoskeletal:  Negative for arthralgias and back pain.  Skin:  Negative for color change and rash.  Neurological:  Positive for dizziness, speech difficulty and weakness. Negative for seizures and syncope.  All other systems reviewed and are negative.   Physical Exam Updated Vital Signs BP (!) 147/69   Pulse 71   Temp 98 F (36.7 C) (Oral)   Resp 12   Ht 1.626 m (5\' 4" )   Wt 72.6 kg   SpO2 91%   BMI 27.46 kg/m  Physical Exam Vitals and nursing note reviewed.  Constitutional:      General: She is  not in acute distress.    Appearance: Normal appearance. She is well-developed.  HENT:     Head: Normocephalic and atraumatic.  Eyes:     Extraocular Movements: Extraocular movements intact.     Conjunctiva/sclera: Conjunctivae normal.     Pupils: Pupils are equal, round, and reactive to light.  Cardiovascular:     Rate and Rhythm: Normal rate and regular rhythm.     Heart sounds: No murmur heard. Pulmonary:     Effort: Pulmonary effort is normal. No respiratory distress.     Breath sounds: Normal breath sounds.  Abdominal:     Palpations: Abdomen is soft.     Tenderness: There is no abdominal tenderness.  Musculoskeletal:        General: No swelling.     Cervical back: Neck supple.  Skin:    General: Skin is warm and dry.     Capillary Refill: Capillary refill takes less than 2 seconds.  Neurological:     Mental Status: She is alert.     Cranial Nerves: Cranial nerve deficit present.     Motor: Weakness present.     Comments: Patient on presentation slurred speech and weakness to the left leg.  Psychiatric:        Mood and Affect: Mood normal.     ED Results / Procedures / Treatments   Labs (all labs ordered are listed, but only abnormal results are displayed) Labs Reviewed  CBC - Abnormal; Notable for the following components:      Result Value   Hemoglobin 11.4 (*)    HCT 35.6 (*)    All other components within normal limits  COMPREHENSIVE METABOLIC PANEL - Abnormal; Notable for the following components:   Glucose, Bld 119 (*)    BUN 24 (*)    Creatinine, Ser 1.16 (*)    AST 12 (*)    GFR, Estimated 53 (*)    All other components within normal limits  CBG MONITORING, ED - Abnormal; Notable for the following components:   Glucose-Capillary 102 (*)    All other components within normal limits  ETHANOL  PROTIME-INR  APTT  DIFFERENTIAL  RAPID URINE DRUG SCREEN, HOSP PERFORMED  URINALYSIS, ROUTINE W REFLEX MICROSCOPIC  I-STAT CHEM 8, ED    EKG EKG  Interpretation Date/Time:  Monday July 23 2023 20:23:56 EDT Ventricular Rate:  76 PR Interval:  148 QRS Duration:  91 QT Interval:  418 QTC Calculation: 470 R Axis:   45  Text Interpretation: Sinus rhythm Atrial premature complex Low voltage, extremity and precordial leads Anteroseptal infarct, old Confirmed by Vanetta Mulders [phone]) on 07/23/2023 8:30:07 PM  Radiology CT ANGIO HEAD NECK W WO CM W PERF (CODE STROKE)  Result Date: 07/23/2023 CLINICAL DATA:  Neuro deficit, acute, stroke suspected. Patient awoke from a nap at 4:30 p.m. with left-sided weakness and slurred speech. EXAM: CT ANGIOGRAPHY HEAD  AND NECK CT PERFUSION BRAIN TECHNIQUE: Multidetector CT imaging of the head and neck was performed using the standard protocol during bolus administration of intravenous contrast. Multiplanar CT image reconstructions and MIPs were obtained to evaluate the vascular anatomy. Carotid stenosis measurements (when applicable) are obtained utilizing NASCET criteria, using the distal internal carotid diameter as the denominator. Multiphase CT imaging of the brain was performed following IV bolus contrast injection. Subsequent parametric perfusion maps were calculated using RAPID software. RADIATION DOSE REDUCTION: This exam was performed according to the departmental dose-optimization program which includes automated exposure control, adjustment of the mA and/or kV according to patient size and/or use of iterative reconstruction technique. CONTRAST:  OMNIPAQUE IOHEXOL 350 MG/ML SOLN COMPARISON:  CT head without contrast 07/23/2023. MR head without contrast 02/05/2023. CT angio head and neck 02/04/2023. FINDINGS: CTA NECK FINDINGS Aortic arch: Extensive atherosclerotic calcifications are again noted at the aortic arch great vessel origins. No focal stenosis or dissection is present. No aneurysm is present. Right carotid system: The right common carotid artery is somewhat tortuous without focal  stenosis. Atherosclerotic calcifications are again noted at the right carotid bifurcation without significant stenosis or change. The more distal cervical right ICA is within normal limits. Left carotid system: The left common carotid artery demonstrates mild tortuosity without significant stenosis. The carotid bifurcation demonstrates atherosclerotic calcification without significant stenosis. The cervical left ICA more distally is within normal limits. Vertebral arteries: The left vertebral artery is the dominant vessel. Mild narrowing is present in the left V1 segment. Origins are within normal limits bilaterally. No other significant stenosis is present in either vertebral artery in the neck. Skeleton: Mild degenerative changes are again noted in the cervical spine. The patient is edentulous. Other neck: Soft tissues the neck are otherwise unremarkable. Salivary glands are within normal limits. Thyroid is normal. No significant adenopathy is present. No focal mucosal or submucosal lesions are present. Upper chest: The lung apices are clear. The thoracic inlet is within normal limits. Review of the MIP images confirms the above findings CTA HEAD FINDINGS Anterior circulation: Atherosclerotic calcifications are present within the cavernous internal carotid arteries bilaterally without significant stenosis through the ICA termini. The A1 and M1 segments are normal. The anterior communicating artery is patent. The MCA bifurcations are within normal limits bilaterally. The ACA and MCA branch vessels are normal. Posterior circulation: Atherosclerotic calcifications are present at the dural margin of both vertebral arteries without significant stenosis. PICA origins are visualized and normal. The vertebrobasilar junction basilar artery normal. Both superior cerebellar arteries are patent. Posterior cerebral arteries originate from basilar tip. Moderate stenosis is present within the right P2 segment. The PCA branch  vessels are otherwise normal. Venous sinuses: The dural sinuses are patent. The straight sinus and deep cerebral veins are intact. Cortical veins are within normal limits. No significant vascular malformation is evident. Anatomic variants: None Review of the MIP images confirms the above findings CT Brain Perfusion Findings: ASPECTS: 10/10 CBF (<30%) Volume: 0mL Perfusion (Tmax>6.0s) volume: 0mL IMPRESSION: 1. No emergent large vessel occlusion. 2. Stable atherosclerotic changes at the carotid bifurcations bilaterally and cavernous internal carotid arteries bilaterally without significant stenosis. 3. Mild narrowing of the left V1 segment. 4. Moderate stenosis of the right P2 segment. 5. No other significant proximal stenosis, aneurysm, or branch vessel occlusion within the Circle of Willis. 6. Normal CT perfusion. 7.  Aortic Atherosclerosis (ICD10-I70.0). Electronically Signed   By: Marin Roberts M.D.   On: 07/23/2023 22:25   CT HEAD CODE  STROKE WO CONTRAST  Result Date: 07/23/2023 CLINICAL DATA:  Code stroke. Dizziness, left-sided weakness, facial droop, slurred speech EXAM: CT HEAD WITHOUT CONTRAST TECHNIQUE: Contiguous axial images were obtained from the base of the skull through the vertex without intravenous contrast. RADIATION DOSE REDUCTION: This exam was performed according to the departmental dose-optimization program which includes automated exposure control, adjustment of the mA and/or kV according to patient size and/or use of iterative reconstruction technique. COMPARISON:  02/04/2023 FINDINGS: Brain: No evidence of acute infarction, hemorrhage, mass, mass effect, or midline shift. No hydrocephalus or extra-axial collection. Remote lacunar infarcts in the right pons and right cerebellum. Periventricular white matter changes, likely the sequela of chronic small vessel ischemic disease. Vascular: No hyperdense vessel. Atherosclerotic calcifications in the intracranial carotid and vertebral  arteries. Skull: Negative for fracture or focal lesion. Sinuses/Orbits: Mucosal thickening in the ethmoid air cells. No acute finding in the orbits. Other: The mastoid air cells are well aerated. ASPECTS [facility] Stroke Program Early CT Score) - Ganglionic level infarction (caudate, lentiform nuclei, internal capsule, insula, M1-M3 cortex): 7 - Supraganglionic infarction (M4-M6 cortex): 3 Total score (0-10 with 10 being normal): 10 IMPRESSION: 1. No acute intracranial process. 2. ASPECTS is 10. Code stroke imaging results were communicated on 07/23/2023 at 8:18 pm to provider Quinnton Bury via telephone, who verbally acknowledged these results. Electronically Signed   By: Wiliam Ke M.D.   On: 07/23/2023 20:18    Procedures Procedures    Medications Ordered in ED Medications  iohexol (OMNIPAQUE) 350 MG/ML injection 100 mL (100 mLs Intravenous Contrast Given 07/23/23 2158)    ED Course/ Medical Decision Making/ A&P                                 Medical Decision Making Amount and/or Complexity of Data Reviewed Labs: ordered. Radiology: ordered.  Risk Prescription drug management. Decision regarding hospitalization.  CRITICAL CARE Performed by: Vanetta Mulders Total critical care time: 60 minutes Critical care time was exclusive of separately billable procedures and treating other patients. Critical care was necessary to treat or prevent imminent or life-threatening deterioration. Critical care was time spent personally by me on the following activities: development of treatment plan with patient and/or surrogate as well as nursing, discussions with consultants, evaluation of patient's response to treatment, examination of patient, obtaining history from patient or surrogate, ordering and performing treatments and interventions, ordering and review of laboratory studies, ordering and review of radiographic studies, pulse oximetry and re-evaluation of patient's condition.  Code stroke  activated seen by teleneurology.  They felt that this could be infarct in the same area recommended MRI following CT angio head and neck to rule out LVO.  CT head and neck without any acute findings.  Patient should be able to be admitted here MRI in the morning.  Patient's symptoms have improved significantly 1 back prior to ordering the CT angio head and neck patient's speech was significantly improved and really had no upper extremity or lower extremity weakness  Will contact hospitalist for admission  Patient's labs here alcohol less than 10 INR normal.  CBC no leukocytosis hemoglobin 11.4 platelets 337.  Complete metabolic panel GFR 53 glucose 161.  CT head without any acute findings.  CT angio head and neck no emergent large vessel occlusion.  Mild narrowing of left V1 segment moderate stenosis right P2 segment.    Final Clinical Impression(s) / ED Diagnoses Final diagnoses:  Cerebrovascular accident (  CVA), unspecified mechanism [facility])    Rx / DC Orders ED Discharge Orders     None         Vanetta Mulders, MD 07/23/23 2307

## 2023-07-23 NOTE — Progress Notes (Signed)
Code stroke activated at 2012; patient present when cart rolled into room; previously went to CT.  MRS 2 from previous stroke- residual left sided weakness and slurred speech.  Dr. Patricia Nettle connected at 2020.

## 2023-07-23 NOTE — ED Triage Notes (Signed)
At 2 pm pt started to feel dizzy Pt laid down Pt got up at 4:30 pm, woke up with LEFT sided weakness and slurred speech.  BGL in triage 102 Pt takes plavix  Denies recent trauma   CODE stroke called Informed charge RN and CT

## 2023-07-23 NOTE — Consult Note (Addendum)
CTA - no LVO   [facility] Tele[facility]rology Consult Services   [name], Robin Chandler Date of Birth:   04-Sep-1961 Identification Number:   MRN - [id number] Date of Service:   07/23/2023 20:17:10  Diagnosis:       G45.9 - Transient cerebral ischemic attack, unspecified  Impression:      Recurrent stroke versus TIA versus recrudescence of prior stroke symptoms    Currently patient does not have any disabling symptoms to recommend IV thrombolytic therapy    Noncontrast CT of the head is unremarkable    At this time I would recommend a Plavix loading dose 300 mg once and begin a 21-day course of aspirin 81 mg and Plavix 75 mg daily    Await the results of the CTA head and neck study    Admission for MRI of the brain without contrast  Our recommendations are outlined below.  Recommendations:        Stroke/Telemetry Floor       Neuro Checks       Bedside Swallow Eval       DVT Prophylaxis       IV Fluids, Normal Saline       Head of Bed 30 Degrees       Euglycemia and Avoid Hyperthermia (PRN Acetaminophen)       Bolus with Clopidogrel 300 mg bolus x1 and initiate dual antiplatelet therapy with Aspirin 81 mg daily and Clopidogrel 75 mg daily       Antihypertensives PRN if Blood pressure is greater than 220/120 or there is a concern for End organ damage/contraindications for permissive HTN. If blood pressure is greater than 220/120 give labetalol PO or IV or Vasotec IV with a goal of 15% reduction in BP during the first 24 hours.  Sign Out:       Discussed with Emergency Department Provider    ------------------------------------------------------------------------------  Advanced Imaging: Advanced imaging has been ordered. Results pending.   Metrics: Last Known Well: 07/23/2023 14:00:00 [facility] Notification Time: 07/23/2023 20:17:10 Arrival Time: 07/23/2023 19:51:00 Stamp Time: 07/23/2023 20:17:10 Initial Response Time: 07/23/2023  20:19:00 Symptoms: L sided weakness and slurred speech . Initial patient interaction: 07/23/2023 20:22:15 NIHSS Assessment Completed: 07/23/2023 20:24:25 Patient is not a candidate for Thrombolytic. Thrombolytic Medical Decision: 07/23/2023 20:24:28 Patient was not deemed candidate for Thrombolytic because of following reasons: Resolved symptoms .  CT head showed no acute hemorrhage or acute core infarct.  Primary Provider Notified of Diagnostic Impression and Management Plan on: 07/23/2023 20:54:18    ------------------------------------------------------------------------------  History of Present Illness: Patient is a 62 year old Female.  Patient was brought by private transportation with symptoms of L sided weakness and slurred speech . 62 year old female with a past medical history significant for hypertension hyperlipidemia diabetes stroke in April 2024 confirmed by MRI which revealed a right pontine stroke she did state that she has some slight residual deficits that being subtle weakness in her left arm and left leg and slurred speech at times  Today her last known normal was 2 PM when she developed an acute onset of dizziness left-sided weakness and aphasia these were the same symptoms that her stroke presenting with in April she became concerned and came into the emergency department  Review the records does not note that she is currently on any antiplatelet therapy  She does take medication for her diabetes or hypertension as well as her hyperlipidemia    Past Medical History:      Hypertension  Diabetes Mellitus      Hyperlipidemia      Stroke  Medications:  No Anticoagulant use  No Antiplatelet use Reviewed EMR for current medications  Allergies:  Reviewed  Social History: Drug Use: No  Family History:  There is no family history of premature cerebrovascular disease pertinent to this consultation  ROS : 14 Points Review of Systems was performed  and was negative except mentioned in HPI.  Past Surgical History: There Is No Surgical History Contributory To Today's Visit     Examination: BP(155/58), Pulse(72), Blood Glucose(102) 1A: Level of Consciousness - Alert; keenly responsive + 0 1B: Ask Month and Age - Both Questions Right + 0 1C: Blink Eyes & Squeeze Hands - Performs Both Tasks + 0 2: Test Horizontal Extraocular Movements - Normal + 0 3: Test Visual Fields - No Visual Loss + 0 4: Test Facial Palsy (Use Grimace if Obtunded) - Normal symmetry + 0 5A: Test Left Arm Motor Drift - No Drift for 10 Seconds + 0 5B: Test Right Arm Motor Drift - No Drift for 10 Seconds + 0 6A: Test Left Leg Motor Drift - No Drift for 5 Seconds + 0 6B: Test Right Leg Motor Drift - No Drift for 5 Seconds + 0 7: Test Limb Ataxia (FNF/Heel-Shin) - No Ataxia + 0 8: Test Sensation - Normal; No sensory loss + 0 9: Test Language/Aphasia - Normal; No aphasia + 0 10: Test Dysarthria - Normal + 0 11: Test Extinction/Inattention - No abnormality + 0  NIHSS Score: 0   Pre-Morbid Modified Rankin Scale: 1 Points = No significant disability despite symptoms; able to carry out all usual duties and activities  Spoke with : DR Deretha Emory  This consult was conducted in real time using interactive audio and Immunologist. Patient was informed of the technology being used for this visit and agreed to proceed. Patient located in hospital and provider located at home/office setting.   Patient is being evaluated for possible acute neurologic impairment and high probability of imminent or life-threatening deterioration. I spent total of 35 minutes providing care to this patient, including time for face to face visit via telemedicine, review of medical records, imaging studies and discussion of findings with providers, the patient and/or family.   Dr Glena Norfolk   [facility] For Inpatient follow-up with [facility] physician please call [facility]  [phone]. This is not an outpatient service. Post hospital discharge, please contact hospital directly.  Please do not communicate with [facility] physicians via secure chat. If you have any questions, Please contact [facility]. Please call or reconsult our service if there are any clinical or diagnostic changes.

## 2023-07-24 ENCOUNTER — Telehealth: Payer: Self-pay | Admitting: *Deleted

## 2023-07-24 ENCOUNTER — Observation Stay (HOSPITAL_BASED_OUTPATIENT_CLINIC_OR_DEPARTMENT_OTHER): Payer: Medicaid Other

## 2023-07-24 ENCOUNTER — Other Ambulatory Visit: Payer: Medicaid Other

## 2023-07-24 ENCOUNTER — Observation Stay (HOSPITAL_COMMUNITY): Payer: Medicaid Other

## 2023-07-24 DIAGNOSIS — I350 Nonrheumatic aortic (valve) stenosis: Secondary | ICD-10-CM

## 2023-07-24 DIAGNOSIS — I6389 Other cerebral infarction: Secondary | ICD-10-CM

## 2023-07-24 DIAGNOSIS — I639 Cerebral infarction, unspecified: Secondary | ICD-10-CM

## 2023-07-24 DIAGNOSIS — F419 Anxiety disorder, unspecified: Secondary | ICD-10-CM

## 2023-07-24 DIAGNOSIS — R29818 Other symptoms and signs involving the nervous system: Secondary | ICD-10-CM

## 2023-07-24 DIAGNOSIS — I10 Essential (primary) hypertension: Secondary | ICD-10-CM

## 2023-07-24 DIAGNOSIS — E119 Type 2 diabetes mellitus without complications: Secondary | ICD-10-CM

## 2023-07-24 DIAGNOSIS — F17200 Nicotine dependence, unspecified, uncomplicated: Secondary | ICD-10-CM

## 2023-07-24 LAB — COMPREHENSIVE METABOLIC PANEL
ALT: 16 U/L (ref 0–44)
AST: 17 U/L (ref 15–41)
Albumin: 4 g/dL (ref 3.5–5.0)
Alkaline Phosphatase: 73 U/L (ref 38–126)
Anion gap: 11 (ref 5–15)
BUN: 21 mg/dL (ref 8–23)
CO2: 26 mmol/L (ref 22–32)
Calcium: 9.2 mg/dL (ref 8.9–10.3)
Chloride: 102 mmol/L (ref 98–111)
Creatinine, Ser: 1.07 mg/dL — ABNORMAL HIGH (ref 0.44–1.00)
GFR, Estimated: 58 mL/min — ABNORMAL LOW (ref >60)
Glucose, Bld: 99 mg/dL (ref 70–99)
Potassium: 4.3 mmol/L (ref 3.5–5.1)
Sodium: 139 mmol/L (ref 135–145)
Total Bilirubin: 0.4 mg/dL (ref 0.3–1.2)
Total Protein: 7.3 g/dL (ref 6.5–8.1)

## 2023-07-24 LAB — ECHOCARDIOGRAM COMPLETE
AR max vel: 1.31 cm2
AV Area VTI: 1.45 cm2
AV Area mean vel: 1.37 cm2
AV Mean grad: 11 mm[Hg]
AV Peak grad: 22.5 mm[Hg]
Ao pk vel: 2.37 m/s
Area-P 1/2: 3.42 cm2
Height: 64 in
MV VTI: 2.53 cm2
S' Lateral: 2.9 cm
Weight: 2560 [oz_av]

## 2023-07-24 LAB — CBC WITH DIFFERENTIAL/PLATELET
Abs Immature Granulocytes: 0.03 10*3/uL (ref 0.00–0.07)
Basophils Absolute: 0 10*3/uL (ref 0.0–0.1)
Basophils Relative: 0 %
Eosinophils Absolute: 0.1 10*3/uL (ref 0.0–0.5)
Eosinophils Relative: 2 %
HCT: 37 % (ref 36.0–46.0)
Hemoglobin: 11.6 g/dL — ABNORMAL LOW (ref 12.0–15.0)
Immature Granulocytes: 0 %
Lymphocytes Relative: 34 %
Lymphs Abs: 3 10*3/uL (ref 0.7–4.0)
MCH: 27.6 pg (ref 26.0–34.0)
MCHC: 31.4 g/dL (ref 30.0–36.0)
MCV: 87.9 fL (ref 80.0–100.0)
Monocytes Absolute: 0.5 10*3/uL (ref 0.1–1.0)
Monocytes Relative: 5 %
Neutro Abs: 5.3 10*3/uL (ref 1.7–7.7)
Neutrophils Relative %: 59 %
Platelets: 352 10*3/uL (ref 150–400)
RBC: 4.21 MIL/uL (ref 3.87–5.11)
RDW: 13.9 % (ref 11.5–15.5)
WBC: 8.9 10*3/uL (ref 4.0–10.5)
nRBC: 0 % (ref 0.0–0.2)

## 2023-07-24 LAB — LIPID PANEL
Cholesterol: 131 mg/dL (ref 0–200)
HDL: 35 mg/dL — ABNORMAL LOW (ref >40)
LDL Cholesterol: 47 mg/dL (ref 0–99)
Total CHOL/HDL Ratio: 3.7 {ratio}
Triglycerides: 246 mg/dL — ABNORMAL HIGH (ref <150)
VLDL: 49 mg/dL — ABNORMAL HIGH (ref 0–40)

## 2023-07-24 LAB — RAPID URINE DRUG SCREEN, HOSP PERFORMED
Amphetamines: NOT DETECTED
Barbiturates: NOT DETECTED
Benzodiazepines: NOT DETECTED
Cocaine: NOT DETECTED
Opiates: NOT DETECTED
Tetrahydrocannabinol: NOT DETECTED

## 2023-07-24 LAB — HEMOGLOBIN A1C
Hgb A1c MFr Bld: 7.4 % — ABNORMAL HIGH (ref 4.8–5.6)
Mean Plasma Glucose: 165.68 mg/dL

## 2023-07-24 LAB — MAGNESIUM: Magnesium: 2.4 mg/dL (ref 1.7–2.4)

## 2023-07-24 LAB — CBG MONITORING, ED
Glucose-Capillary: 116 mg/dL — ABNORMAL HIGH (ref 70–99)
Glucose-Capillary: 111 mg/dL — ABNORMAL HIGH (ref 70–99)

## 2023-07-24 MED ORDER — ONDANSETRON HCL 4 MG/2ML IJ SOLN: 4.0000 mg | Freq: Four times a day (QID) | INTRAMUSCULAR | Status: DC | PRN

## 2023-07-24 MED ORDER — INSULIN ASPART 100 UNIT/ML IJ SOLN: 0.0000 [IU] | Freq: Three times a day (TID) | INTRAMUSCULAR | Status: DC

## 2023-07-24 MED ORDER — HYDROXYZINE HCL 25 MG PO TABS: 25.0000 mg | ORAL_TABLET | Freq: Four times a day (QID) | ORAL | Status: DC | PRN

## 2023-07-24 MED ORDER — ATORVASTATIN CALCIUM 40 MG PO TABS
40.0000 mg | ORAL_TABLET | Freq: Every day | ORAL | Status: DC
  Administered 2023-07-24: 40 mg via ORAL
  Filled 2023-07-24: qty 1

## 2023-07-24 MED ORDER — ACETAMINOPHEN 650 MG RE SUPP: 650.0000 mg | Freq: Four times a day (QID) | RECTAL | Status: DC | PRN

## 2023-07-24 MED ORDER — ONDANSETRON HCL 4 MG PO TABS: 4.0000 mg | ORAL_TABLET | Freq: Four times a day (QID) | ORAL | Status: DC | PRN

## 2023-07-24 MED ORDER — MORPHINE SULFATE (PF) 2 MG/ML IV SOLN: 2.0000 mg | INTRAVENOUS | Status: DC | PRN

## 2023-07-24 MED ORDER — TICAGRELOR 90 MG PO TABS: 90.0000 mg | ORAL_TABLET | Freq: Two times a day (BID) | ORAL | 0 refills | Status: AC

## 2023-07-24 MED ORDER — TICAGRELOR 90 MG PO TABS
90.0000 mg | ORAL_TABLET | Freq: Two times a day (BID) | ORAL | Status: DC
  Administered 2023-07-24: 90 mg via ORAL
  Filled 2023-07-24: qty 1

## 2023-07-24 MED ORDER — ASPIRIN 81 MG PO TBEC: 81.0000 mg | DELAYED_RELEASE_TABLET | Freq: Every day | ORAL | Status: DC

## 2023-07-24 MED ORDER — ASPIRIN 325 MG PO TABS: 325.0000 mg | ORAL_TABLET | Freq: Every day | ORAL | 0 refills | Status: AC

## 2023-07-24 MED ORDER — OXYCODONE HCL 5 MG PO TABS: 5.0000 mg | ORAL_TABLET | ORAL | Status: DC | PRN

## 2023-07-24 MED ORDER — PANTOPRAZOLE SODIUM 40 MG PO TBEC
40.0000 mg | DELAYED_RELEASE_TABLET | Freq: Every day | ORAL | Status: DC
  Administered 2023-07-24: 40 mg via ORAL
  Filled 2023-07-24: qty 1

## 2023-07-24 MED ORDER — HEPARIN SODIUM (PORCINE) 5000 UNIT/ML IJ SOLN
5000.0000 [IU] | Freq: Three times a day (TID) | INTRAMUSCULAR | Status: DC
  Administered 2023-07-24: 5000 [IU] via SUBCUTANEOUS
  Filled 2023-07-24: qty 1

## 2023-07-24 MED ORDER — ASPIRIN 81 MG PO TBEC: 81.0000 mg | DELAYED_RELEASE_TABLET | Freq: Every day | ORAL | 0 refills | Status: AC

## 2023-07-24 MED ORDER — INSULIN ASPART 100 UNIT/ML IJ SOLN: 0.0000 [IU] | Freq: Every day | INTRAMUSCULAR | Status: DC

## 2023-07-24 MED ORDER — ASPIRIN 325 MG PO TABS: 325.0000 mg | ORAL_TABLET | Freq: Every day | ORAL | Status: DC

## 2023-07-24 MED ORDER — BUSPIRONE HCL 5 MG PO TABS
7.5000 mg | ORAL_TABLET | Freq: Every day | ORAL | Status: DC
  Administered 2023-07-24: 7.5 mg via ORAL
  Filled 2023-07-24: qty 2

## 2023-07-24 MED ORDER — ACETAMINOPHEN 325 MG PO TABS: 650.0000 mg | ORAL_TABLET | Freq: Four times a day (QID) | ORAL | Status: DC | PRN

## 2023-07-24 MED ORDER — NICOTINE 21 MG/24HR TD PT24
21.0000 mg | MEDICATED_PATCH | Freq: Every day | TRANSDERMAL | Status: DC
  Administered 2023-07-24: 21 mg via TRANSDERMAL
  Filled 2023-07-24: qty 1

## 2023-07-24 MED ORDER — INSULIN GLARGINE-YFGN 100 UNIT/ML ~~LOC~~ SOLN
10.0000 [IU] | Freq: Every day | SUBCUTANEOUS | Status: DC
  Filled 2023-07-24: qty 0.1

## 2023-07-24 NOTE — H&P (Signed)
History and Physical    Patient: Robin Chandler BJY:[id number] DOB: 1961/09/22 DOA: 07/23/2023 DOS: the patient was seen and examined on 07/24/2023 PCP: Jodi Marble, NP  Patient coming from: Home  Chief Complaint:  Chief Complaint  Patient presents with   Code Stroke   HPI: Robin Chandler is a 62 y.o. female with medical history significant of diabetes mellitus type 2, hypertension, history of stroke, presents the ED with a chief complaint of slurred speech and dizziness.  Patient reports around 2 PM she started to feel unsteady on her feet.  She reports when she woke up that morning she was totally normal, and even normal when she went out to eat.  She laid down and took a nap, when she woke up she noticed her legs were heavy.  Her symptoms are getting worse.  Her speech was more slurred.  She felt more unsteady.  She reports she could not sit up.  She had no headache.  She denies asymmetrical weakness at that time, but then reports that her left foot is dragging when she walks since she has been in the ED.  She reports this is the same symptoms she had with her stroke in April, and "it is back."  Patient reports after the stroke in April she did her therapy and stayed out of work like she was supposed to and her left leg weakness was almost gone.  Patient reports that her slurred speech is resolved.  She still has a bit of a heavy feeling in her legs.  She still feels a little unsteady.  Overall her symptoms have improved.  Patient has no other complaints at this time.  Patient does smoke and request nicotine patch.  She does not drink alcohol.  Patient is DNR. Review of Systems: As mentioned in the history of present illness. All other systems reviewed and are negative. Past Medical History:  Diagnosis Date   Diabetes mellitus without complication (HCC)    Hypertension    Past Surgical History:  Procedure Laterality Date   CHOLECYSTECTOMY     Social History:  reports that she has been  smoking. She has never used smokeless tobacco. She reports that she does not drink alcohol and does not use drugs.  No Known Allergies  No family history on file.  Prior to Admission medications   Medication Sig Start Date End Date Taking? Authorizing Provider  atorvastatin (LIPITOR) 40 MG tablet Take 40 mg by mouth daily. 12/14/22   [provider]  busPIRone (BUSPAR) 7.5 MG tablet Take 7.5 mg by mouth daily. 06/25/19   [provider]  cyclobenzaprine (FLEXERIL) 10 MG tablet Take 10 mg by mouth at bedtime. 01/15/23   [provider]  fluticasone (FLONASE) 50 MCG/ACT nasal spray Place 2 sprays into both nostrils daily. 05/07/23   [provider]  hydrOXYzine (ATARAX/VISTARIL) 25 MG tablet Take 25-50 mg by mouth every 6 (six) hours as needed. For anxiety 05/30/19   [provider]  Insulin Glargine (BASAGLAR KWIKPEN) 100 UNIT/ML SOPN Inject 20 Units into the skin at bedtime.  06/14/19   [provider]  JANUMET 50-1000 MG tablet Take 1 tablet by mouth 2 (two) times daily. 06/24/19   [provider]  JARDIANCE 25 MG TABS tablet Take 25 mg by mouth daily. 12/14/22   [provider]  lisinopril (ZESTRIL) 20 MG tablet Take 20 mg by mouth daily. 06/13/19   [provider]  pantoprazole (PROTONIX) 40 MG tablet Take 1 tablet (40 mg  total) by mouth daily. 02/05/23 05/06/23  Narda Bonds, MD  traZODone (DESYREL) 50 MG tablet Take 50 mg by mouth at bedtime. 01/15/23   [provider]  vitamin C (ASCORBIC ACID) 500 MG tablet Take 500 mg by mouth daily.    [provider]    Physical Exam: Vitals:   07/24/23 0200 07/24/23 0210 07/24/23 0300 07/24/23 0445  BP: (!) 145/73  (!) 147/66 (!) 152/65  Pulse: 73  73 90  Resp: 17  18 (!) 24  Temp:  97.6 F (36.4 C)    TempSrc:  Oral    SpO2: 92%  99% 94%  Weight:      Height:       1.  General: Patient lying supine in bed,  no acute distress   2. Psychiatric: Alert  and oriented x 3, mood and behavior normal for situation, pleasant and cooperative with exam   3. Neurologic: Speech and language are normal, face is symmetric, moves all 4 extremities voluntarily, left leg is subtly weak compared to the right  4. HEENMT:  Head is atraumatic, normocephalic, pupils reactive to light, neck is supple, trachea is midline, mucous membranes are moist   5. Respiratory : Lungs are clear to auscultation bilaterally without wheezing, rhonchi, rales, no cyanosis, no increase in work of breathing or accessory muscle use   6. Cardiovascular : Heart rate normal, rhythm is regular, no murmurs, rubs or gallops, no peripheral edema, peripheral pulses palpated   7. Gastrointestinal:  Abdomen is soft, nondistended, nontender to palpation bowel sounds active, no masses or organomegaly palpated   8. Skin:  Skin is warm, dry and intact without rashes, acute lesions, or ulcers on limited exam   9.Musculoskeletal:  No acute deformities or trauma, no asymmetry in tone, no peripheral edema, peripheral pulses palpated, no tenderness to palpation in the extremities  Data Reviewed: In the ED Patient is afebrile, normal heart rate, normal respiratory rate, nearly normotensive the whole time, maintaining oxygen sats on room air No leukocytosis, hemoglobin 11.4 Chemistries unremarkable Alcohol levels undetectable EKG shows a heart rate of 76, sinus rhythm, QTc 470 CT head shows no acute changes CTA shows no emergent large vessel occlusion but it does show mild narrowing of the left P1 segment and moderate stenosis of the right P2.  Normal perfusion CT Neurology was consulted and recommended admission for stroke workup   Assessment and Plan: * Acute focal neurological deficit - Patient reports unsteadiness and slurred speech - Previously patient had a stroke with left-sided weakness in April - Today CT head shows no acute stroke - CTA shows no emergent large vessel occlusion  but it does show mild narrowing of the left V1 segment, moderate stenosis of the right P2, and normal CT perfusion - Last known well was just before 2 PM - Neurology consulted and recommended admission for stroke workup - MRI in the a.m. - Echo in the a.m. - PT/OT/ST eval and treat - Monitor on telemetry  Anxiety - Continue BuSpar  Tobacco use disorder - Continue nicotine patch  HTN (hypertension) - Holding lisinopril for permissive hypertension  DMII (diabetes mellitus, type 2) (HCC) - 20 units of basal insulin at baseline - Continue reduced rate of basal insulin here - Sliding scale coverage - Continue to monitor      Advance Care Planning:   Code Status: Limited: Do not attempt resuscitation (DNR) -DNR-LIMITED -Do Not Intubate/DNI   Consults: Neurology  Family Communication: No family at bedside  Severity  of Illness: The appropriate patient status for this patient is OBSERVATION. Observation status is judged to be reasonable and necessary in order to provide the required intensity of service to ensure the patient's safety. The patient's presenting symptoms, physical exam findings, and initial radiographic and laboratory data in the context of their medical condition is felt to place them at decreased risk for further clinical deterioration. Furthermore, it is anticipated that the patient will be medically stable for discharge from the hospital within 2 midnights of admission.   Author: Lilyan Gilford, DO 07/24/2023 6:11 AM  For on call review [email or web address].

## 2023-07-24 NOTE — Progress Notes (Signed)
Transition of Care Department [facility]) has reviewed patient and no TOC needs have been identified at this time. We will continue to monitor patient advancement through interdisciplinary progression rounds. If new patient transition needs arise, please place a TOC consult.   07/24/23 0915  TOC Brief Assessment  Insurance and Status Reviewed  Patient has primary care physician Yes  Home environment has been reviewed Lives with brother.  Prior level of function: Independent.  Prior/Current Home Services No current home services  Social Determinants of Health Reivew SDOH reviewed no interventions necessary  Readmission risk has been reviewed Yes  Transition of care needs no transition of care needs at this time

## 2023-07-24 NOTE — Assessment & Plan Note (Addendum)
07-23-2023  Patient reports unsteadiness and slurred speech. Previously patient had a stroke with left-sided weakness in April. Today CT head shows no acute stroke. CTA shows no emergent large vessel occlusion but it does show mild narrowing of the left V1 segment, moderate stenosis of the right P2, and normal CT perfusion. Last known well was just before 2 PM.  Neurology consulted and recommended admission for stroke workup. MRI in the a.m. Echo in the a.m. PT/OT/ST eval and treat. Monitor on  Telemetry  07-24-2023. Pt MRI shows acute CVA. Neurology consulted. Pt placed on BID Brilinta 90 mg and ASA 81 mg every day for 30 days. Then stop brilinta and continue with ASA 325 mg daily. Add lipid panel. Pt already on lipitor at home.  Have arranged with cardiology to have pt receive 30 day event recorder. Pt did not get this done when she last have her acute CVA in April 2024. 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  Discussed with pt that Brilinta commonly causes pt to feel dyspnea especially with exertion. Pt needs to be aware of this side effect of Brilinta. Pt seen by PT/OT. No skilled needs.

## 2023-07-24 NOTE — Evaluation (Signed)
Physical Therapy Evaluation Patient Details Name: Robin Chandler MRN: [id number] DOB: March 18, 1961 Today's Date: 07/24/2023  History of Present Illness  Robin Chandler is a 62 y.o. female with medical history significant of diabetes mellitus type 2, hypertension, history of stroke, presents the ED with a chief complaint of slurred speech and dizziness.  Patient reports around 2 PM she started to feel unsteady on her feet.  She reports when she woke up that morning she was totally normal, and even normal when she went out to eat.  She laid down and took a nap, when she woke up she noticed her legs were heavy.  Her symptoms are getting worse.  Her speech was more slurred.  She felt more unsteady.  She reports she could not sit up.  She had no headache.  She denies asymmetrical weakness at that time, but then reports that her left foot is dragging when she walks since she has been in the ED.  She reports this is the same symptoms she had with her stroke in April, and "it is back."  Patient reports after the stroke in April she did her therapy and stayed out of work like she was supposed to and her left leg weakness was almost gone.  Patient reports that her slurred speech is resolved.  She still has a bit of a heavy feeling in her legs.  She still feels a little unsteady.  Overall her symptoms have improved.  Patient has no other complaints at this time. (per DO)   Clinical Impression  Patient functioning near baseline for functional mobility and gait demonstrating good return for ambulating in room/hallways without loss of balance.  Plan:  Patient discharged from physical therapy to care of nursing for ambulation daily as tolerated for length of stay.          If plan is discharge home, recommend the following: Other (comment) (near normal)   Can travel by private vehicle        Equipment Recommendations None recommended by PT  Recommendations for Other Services       Functional Status Assessment  Patient has not had a recent decline in their functional status     Precautions / Restrictions Precautions Precautions: Fall Restrictions Weight Bearing Restrictions: No      Mobility  Bed Mobility Overal bed mobility: Independent                  Transfers Overall transfer level: Independent                      Ambulation/Gait Ambulation/Gait assistance: Modified independent (Device/Increase time) Gait Distance (Feet): 100 Feet Assistive device: None Gait Pattern/deviations: WFL(Within Functional Limits) Gait velocity: decreased     General Gait Details: good return for ambulating in room/hallways without loss of balance  Stairs            Wheelchair Mobility     Tilt Bed    Modified Rankin (Stroke Patients Only)       Balance Overall balance assessment: Independent                                           Pertinent Vitals/Pain Pain Assessment Pain Assessment: No/denies pain    Home Living Family/patient expects to be discharged to:: Private residence Living Arrangements: Other relatives Available Help at Discharge: Family;Available PRN/intermittently Type of Home: House  Entrance Stairs-Rails: Can reach both;Right;Left Entrance Stairs-Number of Steps: 10   Home Layout: One level        Prior Function Prior Level of Function : Independent/Modified Independent             Mobility Comments: Community ambulator without AD; drives ADLs Comments: Independent     Extremity/Trunk Assessment   Upper Extremity Assessment Upper Extremity Assessment: Defer to OT evaluation    Lower Extremity Assessment Lower Extremity Assessment: Overall WFL for tasks assessed    Cervical / Trunk Assessment Cervical / Trunk Assessment: Normal  Communication   Communication Communication: No apparent difficulties  Cognition Arousal: Alert Behavior During Therapy: WFL for tasks assessed/performed Overall  Cognitive Status: Within Functional Limits for tasks assessed                                          General Comments      Exercises     Assessment/Plan    PT Assessment    PT Problem List         PT Treatment Interventions      PT Goals (Current goals can be found in the Care Plan section)  Acute Rehab PT Goals Patient Stated Goal: return home with family to assist PT Goal Formulation: With patient Time For Goal Achievement: 07/24/23 Potential to Achieve Goals: Good    Frequency       Co-evaluation   Reason for Co-Treatment: To address functional/ADL transfers   OT goals addressed during session: ADL's and self-care       AM-PAC PT "6 Clicks" Mobility  Outcome Measure Help needed turning from your back to your side while in a flat bed without using bedrails?: None Help needed moving from lying on your back to sitting on the side of a flat bed without using bedrails?: None Help needed moving to and from a bed to a chair (including a wheelchair)?: None Help needed standing up from a chair using your arms (e.g., wheelchair or bedside chair)?: None Help needed to walk in hospital room?: None Help needed climbing 3-5 steps with a railing? : None 6 Click Score: 24    End of Session   Activity Tolerance: Patient tolerated treatment well Patient left: in bed Nurse Communication: Mobility status      Time: 1610-9604 PT Time Calculation (min) (ACUTE ONLY): 14 min   Charges:   PT Evaluation $PT Eval Low Complexity: 1 Low PT Treatments $Therapeutic Activity: 8-22 mins PT General Charges $$ ACUTE PT VISIT: 1 Visit         1:57 PM, 07/24/23 Ocie Bob, MPT Physical Therapist with [facility] [phone] office [phone] mobile phone

## 2023-07-24 NOTE — Discharge Summary (Addendum)
[facility] Physician Discharge Summary   Patient name: Robin Chandler  Admit date:     07/23/2023  Discharge date: 07/24/2023  Attending Physician: Lilyan Gilford [[id number]]  Discharge Physician: Carollee Herter   PCP: Jodi Marble, NP  Admitted From: Home  Disposition:  Home  Recommendations for Outpatient Follow-up:  Follow up with PCP in 1-2 weeks Cardiology will mail 30 event monitor to pt's home with instructions Please follow up on the following pending results: lipid panel, HgA1c  Home Health:No Equipment/Devices: None  Discharge Condition:Stable CODE STATUS:FULL Diet recommendation: Diabetic Fluid Restriction: None  Hospital Summary: HPI: Robin Chandler is a 62 y.o. female with medical history significant of diabetes mellitus type 2, hypertension, history of stroke, presents the ED with a chief complaint of slurred speech and dizziness.  Patient reports around 2 PM she started to feel unsteady on her feet.  She reports when she woke up that morning she was totally normal, and even normal when she went out to eat.  She laid down and took a nap, when she woke up she noticed her legs were heavy.  Her symptoms are getting worse.  Her speech was more slurred.  She felt more unsteady.  She reports she could not sit up.  She had no headache.  She denies asymmetrical weakness at that time, but then reports that her left foot is dragging when she walks since she has been in the ED.  She reports this is the same symptoms she had with her stroke in April, and "it is back."  Patient reports after the stroke in April she did her therapy and stayed out of work like she was supposed to and her left leg weakness was almost gone.  Patient reports that her slurred speech is resolved.  She still has a bit of a heavy feeling in her legs.  She still feels a little unsteady.  Overall her symptoms have improved.  Patient has no other complaints at this time.   Significant Events: Admitted  07/23/2023 for TIA vs CVA MRI brain showed Acute infarcts involving the body of the caudate and the lentiform nucleus on the left. No hemorrhage.  Significant Labs: Lipid panel pending  Significant Imaging Studies: MRI brain showed Acute infarcts involving the body of the caudate and the lentiform nucleus on the left. No hemorrhage. Echo LVEF 65%, mild/moderate aortic stenosis. Mean gradient 11, peak gradient 22.5, AVA 1.45 cm2. No interatrial shunt  Antibiotic Therapy: Anti-infectives (From admission, onward)    None       Procedures:   Consultants: Neurology   Hospital Course by Problem: * Acute ischemic stroke (HCC) 07-23-2023  Patient reports unsteadiness and slurred speech. Previously patient had a stroke with left-sided weakness in April. Today CT head shows no acute stroke. CTA shows no emergent large vessel occlusion but it does show mild narrowing of the left V1 segment, moderate stenosis of the right P2, and normal CT perfusion. Last known well was just before 2 PM.  Neurology consulted and recommended admission for stroke workup. MRI in the a.m. Echo in the a.m. PT/OT/ST eval and treat. Monitor on  Telemetry  07-24-2023. Pt MRI shows acute CVA. Neurology consulted. Pt placed on BID Brilinta 90 mg and ASA 81 mg every day for 30 days. Then stop brilinta and continue with ASA 325 mg daily. Add lipid panel. Pt already on lipitor at home.  Have arranged with cardiology to have pt receive 30 day event recorder. Pt did not get this  done when she last have her acute CVA in April 2024. 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  Discussed with pt that Brilinta commonly causes pt to feel dyspnea especially with exertion. Pt needs to be aware of this side effect of Brilinta. Pt seen by PT/OT. No skilled needs.  Aortic stenosis - by echo 07-24-2023 mean gradient 11 mmHg, peak gradient 22.5 mmHg, AVA 1.45 cm2. Seen on echo. Pt without any symptoms. Will need yearly followup by PCP. Consider  cards referral as outpatient.  Anxiety - Continue BuSpar  Tobacco use disorder - Continue nicotine patch  HTN (hypertension) Lisinopril held on admission per neurology advice  07-24-2023 neurology wants normotension at this point. Pt's HTN meds not changed  DMII (diabetes mellitus, type 2) (HCC) 07-23-2023  20 units of basal insulin at baseline.  Continue reduced rate of basal insulin here.  Sliding scale coverage. Continue to monitor 07-24-2023 add on HgA1c. PCP can follow up.    Discharge Diagnoses:  Principal Problem:   Acute ischemic stroke [facility]) Active Problems:   DMII (diabetes mellitus, type 2) (HCC)   HTN (hypertension)   Tobacco use disorder   Anxiety   Aortic stenosis - by echo 07-24-2023 mean gradient 11 mmHg, peak gradient 22.5 mmHg, AVA 1.45 cm2.   Discharge Instructions  Discharge Instructions     Ambulatory referral to Neurology   Complete by: As directed    An appointment is requested in approximately: 8-12 weeks   Call MD for:  difficulty breathing, headache or visual disturbances   Complete by: As directed    Call MD for:  extreme fatigue   Complete by: As directed    Call MD for:  hives   Complete by: As directed    Call MD for:  persistant dizziness or light-headedness   Complete by: As directed    Call MD for:  persistant nausea and vomiting   Complete by: As directed    Call MD for:  redness, tenderness, or signs of infection (pain, swelling, redness, odor or green/yellow discharge around incision site)   Complete by: As directed    Call MD for:  temperature >100.4   Complete by: As directed    Diet - low sodium heart healthy   Complete by: As directed    Discharge instructions   Complete by: As directed    1. Followup with your primary care provider in 1-2 weeks following discharge 2. Stroke followup clinic will call you for appointment 3. Cardiology office will call you to set up a time to get your heart monitor.   Increase activity  slowly   Complete by: As directed       Allergies as of 07/24/2023   No Known Allergies      Medication List     TAKE these medications    amLODipine 10 MG tablet Commonly known as: NORVASC Take 10 mg by mouth daily.   ascorbic acid 500 MG tablet Commonly known as: VITAMIN C Take 500 mg by mouth daily.   aspirin EC 81 MG tablet Take 81 mg by mouth daily. Swallow whole. What changed: Another medication with the same name was added. Make sure you understand how and when to take each.   aspirin EC 81 MG tablet Take 1 tablet (81 mg total) by mouth daily. Swallow whole. Start taking on: July 25, 2023 What changed: You were already taking a medication with the same name, and this prescription was added. Make sure you understand how and  when to take each.   aspirin 325 MG tablet Take 1 tablet (325 mg total) by mouth daily. Start taking on: August 24, 2023 What changed: You were already taking a medication with the same name, and this prescription was added. Make sure you understand how and when to take each.   atorvastatin 40 MG tablet Commonly known as: LIPITOR Take 40 mg by mouth daily.   Basaglar KwikPen 100 UNIT/ML Inject 20 Units into the skin at bedtime.   busPIRone 7.5 MG tablet Commonly known as: BUSPAR Take 7.5 mg by mouth daily.   cyclobenzaprine 10 MG tablet Commonly known as: FLEXERIL Take 10 mg by mouth at bedtime.   fluticasone 50 MCG/ACT nasal spray Commonly known as: FLONASE Place 2 sprays into both nostrils daily.   hydrOXYzine 25 MG tablet Commonly known as: ATARAX Take 25-50 mg by mouth every 6 (six) hours as needed. For anxiety What changed: Another medication with the same name was removed. Continue taking this medication, and follow the directions you see here.   Janumet 50-1000 MG tablet Generic drug: sitaGLIPtin-metformin Take 1 tablet by mouth 2 (two) times daily.   Jardiance 25 MG Tabs tablet Generic drug: empagliflozin Take  25 mg by mouth daily.   lisinopril 20 MG tablet Commonly known as: ZESTRIL Take 20 mg by mouth daily.   metFORMIN 1000 MG tablet Commonly known as: GLUCOPHAGE Take 1,000 mg by mouth 2 (two) times daily.   nicotine 21 mg/24hr patch Commonly known as: NICODERM CQ - dosed in mg/24 hours Place 21 mg onto the skin daily.   pantoprazole 40 MG tablet Commonly known as: Protonix Take 1 tablet (40 mg total) by mouth daily.   sertraline 50 MG tablet Commonly known as: ZOLOFT Take 50 mg by mouth daily.   ticagrelor 90 MG Tabs tablet Commonly known as: BRILINTA Take 1 tablet (90 mg total) by mouth 2 (two) times daily. What changed:  medication strength how much to take   traZODone 50 MG tablet Commonly known as: DESYREL Take 50 mg by mouth at bedtime.       No Known Allergies  Discharge Exam: Vitals:   07/24/23 0700 07/24/23 0918  BP: (!) 150/80   Pulse: 70   Resp:    Temp:  97.9 F (36.6 C)  SpO2: 100%     Physical Exam Vitals and nursing note reviewed.  Constitutional:      General: She is not in acute distress.    Appearance: Normal appearance. She is not toxic-appearing or diaphoretic.  HENT:     Head: Normocephalic and atraumatic.     Nose: Nose normal.  Eyes:     General: No scleral icterus. Cardiovascular:     Rate and Rhythm: Normal rate and regular rhythm.     Heart sounds: Murmur heard.  Pulmonary:     Effort: Pulmonary effort is normal.     Breath sounds: Normal breath sounds.  Abdominal:     General: Bowel sounds are normal. There is no distension.     Palpations: Abdomen is soft.  Musculoskeletal:     Right lower leg: No edema.     Left lower leg: No edema.  Skin:    General: Skin is warm and dry.     Capillary Refill: Capillary refill takes less than 2 seconds.  Neurological:     General: No focal deficit present.     Mental Status: She is alert and oriented to person, place, and time.    The results  of significant diagnostics from this  hospitalization (including imaging, microbiology, ancillary and laboratory) are listed below for reference.    Labs:  Basic Metabolic Panel: Recent Labs  Lab 07/23/23 2024 07/24/23 0515  NA 137 139  K 4.3 4.3  CL 104 102  CO2 25 26  GLUCOSE 119* 99  BUN 24* 21  CREATININE 1.16* 1.07*  CALCIUM 8.9 9.2  MG  --  2.4   Liver Function Tests: Recent Labs  Lab 07/23/23 2024 07/24/23 0515  AST 12* 17  ALT 13 16  ALKPHOS 70 73  BILITOT 0.4 0.4  PROT 7.4 7.3  ALBUMIN 4.2 4.0   CBC: Recent Labs  Lab 07/23/23 2024 07/24/23 0515  WBC 6.7 8.9  NEUTROABS 3.6 5.3  HGB 11.4* 11.6*  HCT 35.6* 37.0  MCV 88.6 87.9  PLT 337 352   CBG: Recent Labs  Lab 07/23/23 2001 07/24/23 0813 07/24/23 1232  GLUCAP 102* 116* 111*   Urinalysis    Component Value Date/Time   COLORURINE STRAW (A) 07/23/2023 2343   APPEARANCEUR HAZY (A) 07/23/2023 2343   LABSPEC 1.024 07/23/2023 2343   PHURINE 7.0 07/23/2023 2343   GLUCOSEU >=500 (A) 07/23/2023 2343   HGBUR NEGATIVE 07/23/2023 2343   BILIRUBINUR NEGATIVE 07/23/2023 2343   KETONESUR NEGATIVE 07/23/2023 2343   PROTEINUR 30 (A) 07/23/2023 2343   NITRITE NEGATIVE 07/23/2023 2343   LEUKOCYTESUR MODERATE (A) 07/23/2023 2343   Sepsis Labs Recent Labs  Lab 07/23/23 2024 07/24/23 0515  WBC 6.7 8.9   Procedures/Studies: ECHOCARDIOGRAM COMPLETE  Result Date: 07/24/2023    ECHOCARDIOGRAM REPORT   Patient Name:   JAKAYAH TIMBROOK Date of Exam: 07/24/2023 Medical Rec #:  [id number]    Height:       64.0 in Accession #:    [id number]   Weight:       160.0 lb Date of Birth:  06/19/1961     BSA:          1.779 m Patient Age:    62 years     BP:           139/65 mmHg Patient Gender: F            HR:           59 bpm. [facility] Procedure: 2D Echo, Cardiac Doppler and Color Doppler Indications:     Stroke  History:         Patient has prior history of Echocardiogram examinations, most                  recent 02/05/2023. Stroke; Risk  Factors:Hypertension, Diabetes                  and Current Smoker.  Sonographer:     Mikki Harbor Referring Phys:  [id number] ASIA B ZIERLE-GHOSH Diagnosing Phys: Dina Rich MD  Sonographer Comments: Image acquisition challenging due to respiratory motion. IMPRESSIONS  1. Left ventricular ejection fraction, by estimation, is 65 to 70%. The left ventricle has normal function. The left ventricle has no regional wall motion abnormalities. There is moderate left ventricular hypertrophy. Left ventricular diastolic parameters are consistent with Grade I diastolic dysfunction (impaired relaxation).  2. Right ventricular systolic function is normal. The right ventricular size is normal.  3. The mitral valve is normal in structure. No evidence of mitral valve regurgitation. No evidence of mitral stenosis.  4. The aortic valve has an indeterminant number of cusps. There is severe calcifcation of the aortic  valve. There is severe thickening of the aortic valve. Aortic valve regurgitation is moderate. Mild to moderate aortic valve stenosis. Mild to moderate aortic stenosis is present. Aortic valve mean gradient measures 11.0 mmHg. Aortic valve peak gradient measures 22.5 mmHg. Aortic valve area, by VTI measures 1.45 cm.  5. Aortic dilatation noted. There is mild dilatation of the ascending aorta, measuring 36 mm. FINDINGS  Left Ventricle: Left ventricular ejection fraction, by estimation, is 65 to 70%. The left ventricle has normal function. The left ventricle has no regional wall motion abnormalities. The left ventricular internal cavity size was normal in size. There is  moderate left ventricular hypertrophy. Left ventricular diastolic parameters are consistent with Grade I diastolic dysfunction (impaired relaxation). Normal left ventricular filling pressure. Right Ventricle: The right ventricular size is normal. Right vetricular wall thickness was not well visualized. Right ventricular systolic function is normal.  Left Atrium: Left atrial size was normal in size. Right Atrium: Right atrial size was normal in size. Pericardium: There is no evidence of pericardial effusion. Mitral Valve: The mitral valve is normal in structure. No evidence of mitral valve regurgitation. No evidence of mitral valve stenosis. MV peak gradient, 4.5 mmHg. The mean mitral valve gradient is 2.0 mmHg. Tricuspid Valve: The tricuspid valve is normal in structure. Tricuspid valve regurgitation is not demonstrated. No evidence of tricuspid stenosis. Aortic Valve: The aortic valve has an indeterminant number of cusps. There is severe calcifcation of the aortic valve. There is severe thickening of the aortic valve. There is severe aortic valve annular calcification. Aortic valve regurgitation is moderate. Mild to moderate aortic stenosis is present. Aortic valve mean gradient measures 11.0 mmHg. Aortic valve peak gradient measures 22.5 mmHg. Aortic valve area, by VTI measures 1.45 cm. Pulmonic Valve: The pulmonic valve was not well visualized. Pulmonic valve regurgitation is not visualized. No evidence of pulmonic stenosis. Aorta: The aortic root is normal in size and structure and aortic dilatation noted. There is mild dilatation of the ascending aorta, measuring 36 mm. Venous: IVC is small suggesting low RA pressure and hypovolemia.  IAS/Shunts: No atrial level shunt detected by color flow Doppler.  LEFT VENTRICLE PLAX 2D LVIDd:         4.70 cm   Diastology LVIDs:         2.90 cm   LV e' medial:    6.31 cm/s LV PW:         1.20 cm   LV E/e' medial:  12.2 LV IVS:        1.40 cm   LV e' lateral:   8.59 cm/s LVOT diam:     1.90 cm   LV E/e' lateral: 8.9 LV SV:         79 LV SV Index:   44 LVOT Area:     2.84 cm  RIGHT VENTRICLE RV Basal diam:  3.70 cm RV S prime:     12.10 cm/s TAPSE (M-mode): 2.3 cm LEFT ATRIUM           Index        RIGHT ATRIUM           Index LA diam:      3.50 cm 1.97 cm/m   RA Area:     13.40 cm LA Vol (A2C): 48.0 ml 26.98 ml/m   RA Volume:   30.90 ml  17.37 ml/m LA Vol (A4C): 48.5 ml 27.26 ml/m  AORTIC VALVE  PULMONIC VALVE AV Area (Vmax):    1.31 cm      PV Vmax:       0.88 m/s AV Area (Vmean):   1.37 cm      PV Peak grad:  3.1 mmHg AV Area (VTI):     1.45 cm AV Vmax:           237.33 cm/s AV Vmean:          149.667 cm/s AV VTI:            0.547 m AV Peak Grad:      22.5 mmHg AV Mean Grad:      11.0 mmHg LVOT Vmax:         110.00 cm/s LVOT Vmean:        72.350 cm/s LVOT VTI:          0.279 m LVOT/AV VTI ratio: 0.51  AORTA Ao Root diam: 3.00 cm Ao Asc diam:  3.60 cm MITRAL VALVE MV Area (PHT): 3.42 cm     SHUNTS MV Area VTI:   2.53 cm     Systemic VTI:  0.28 m MV Peak grad:  4.5 mmHg     Systemic Diam: 1.90 cm MV Mean grad:  2.0 mmHg MV Vmax:       1.06 m/s MV Vmean:      57.1 cm/s MV Decel Time: 222 msec MV E velocity: 76.70 cm/s MV A velocity: 108.00 cm/s MV E/A ratio:  0.71 Dina Rich MD Electronically signed by Dina Rich MD Signature Date/Time: 07/24/2023/1:28:24 PM    Final (Updated)    MR BRAIN WO CONTRAST  Result Date: 07/24/2023 CLINICAL DATA:  Neuro deficit, acute, stroke suspected EXAM: MRI HEAD WITHOUT CONTRAST TECHNIQUE: Multiplanar, multiecho pulse sequences of the brain and surrounding structures were obtained without intravenous contrast. COMPARISON:  Brain MR 02/05/2023, CT Head 07/23/2023 FINDINGS: Brain: There is an acute infarct involving the body of the caudate and the left lentiform nucleus. No hemorrhage. No hydrocephalus. No extra-axial fluid collection. There are chronic infarcts in the right cerebellum, right hemi pons, and the caudate head on the left. There is a background of mild chronic microvascular ischemic change. No mass effect. No mass lesion. Partially empty sella. Vascular: Normal flow voids. Skull and upper cervical spine: Normal marrow signal. Sinuses/Orbits: Polypoid mucosal thickening in the right maxillary sinus and mild mucosal thickening in the posterior  ethmoid air cells on the right. Orbits are unremarkable. No middle ear or mastoid effusion. Other: None. IMPRESSION: Acute infarcts involving the body of the caudate and the lentiform nucleus on the left. No hemorrhage. Electronically Signed   By: Lorenza Cambridge M.D.   On: 07/24/2023 08:19   CT ANGIO HEAD NECK W WO CM W PERF (CODE STROKE)  Result Date: 07/23/2023 CLINICAL DATA:  Neuro deficit, acute, stroke suspected. Patient awoke from a nap at 4:30 p.m. with left-sided weakness and slurred speech. EXAM: CT ANGIOGRAPHY HEAD AND NECK CT PERFUSION BRAIN TECHNIQUE: Multidetector CT imaging of the head and neck was performed using the standard protocol during bolus administration of intravenous contrast. Multiplanar CT image reconstructions and MIPs were obtained to evaluate the vascular anatomy. Carotid stenosis measurements (when applicable) are obtained utilizing NASCET criteria, using the distal internal carotid diameter as the denominator. Multiphase CT imaging of the brain was performed following IV bolus contrast injection. Subsequent parametric perfusion maps were calculated using RAPID software. RADIATION DOSE REDUCTION: This exam was performed according to the departmental dose-optimization program which includes automated  exposure control, adjustment of the mA and/or kV according to patient size and/or use of iterative reconstruction technique. CONTRAST:  OMNIPAQUE IOHEXOL 350 MG/ML SOLN COMPARISON:  CT head without contrast 07/23/2023. MR head without contrast 02/05/2023. CT angio head and neck 02/04/2023. FINDINGS: CTA NECK FINDINGS Aortic arch: Extensive atherosclerotic calcifications are again noted at the aortic arch great vessel origins. No focal stenosis or dissection is present. No aneurysm is present. Right carotid system: The right common carotid artery is somewhat tortuous without focal stenosis. Atherosclerotic calcifications are again noted at the right carotid bifurcation without  significant stenosis or change. The more distal cervical right ICA is within normal limits. Left carotid system: The left common carotid artery demonstrates mild tortuosity without significant stenosis. The carotid bifurcation demonstrates atherosclerotic calcification without significant stenosis. The cervical left ICA more distally is within normal limits. Vertebral arteries: The left vertebral artery is the dominant vessel. Mild narrowing is present in the left V1 segment. Origins are within normal limits bilaterally. No other significant stenosis is present in either vertebral artery in the neck. Skeleton: Mild degenerative changes are again noted in the cervical spine. The patient is edentulous. Other neck: Soft tissues the neck are otherwise unremarkable. Salivary glands are within normal limits. Thyroid is normal. No significant adenopathy is present. No focal mucosal or submucosal lesions are present. Upper chest: The lung apices are clear. The thoracic inlet is within normal limits. Review of the MIP images confirms the above findings CTA HEAD FINDINGS Anterior circulation: Atherosclerotic calcifications are present within the cavernous internal carotid arteries bilaterally without significant stenosis through the ICA termini. The A1 and M1 segments are normal. The anterior communicating artery is patent. The MCA bifurcations are within normal limits bilaterally. The ACA and MCA branch vessels are normal. Posterior circulation: Atherosclerotic calcifications are present at the dural margin of both vertebral arteries without significant stenosis. PICA origins are visualized and normal. The vertebrobasilar junction basilar artery normal. Both superior cerebellar arteries are patent. Posterior cerebral arteries originate from basilar tip. Moderate stenosis is present within the right P2 segment. The PCA branch vessels are otherwise normal. Venous sinuses: The dural sinuses are patent. The straight sinus and  deep cerebral veins are intact. Cortical veins are within normal limits. No significant vascular malformation is evident. Anatomic variants: None Review of the MIP images confirms the above findings CT Brain Perfusion Findings: ASPECTS: 10/10 CBF (<30%) Volume: 0mL Perfusion (Tmax>6.0s) volume: 0mL IMPRESSION: 1. No emergent large vessel occlusion. 2. Stable atherosclerotic changes at the carotid bifurcations bilaterally and cavernous internal carotid arteries bilaterally without significant stenosis. 3. Mild narrowing of the left V1 segment. 4. Moderate stenosis of the right P2 segment. 5. No other significant proximal stenosis, aneurysm, or branch vessel occlusion within the Circle of Willis. 6. Normal CT perfusion. 7.  Aortic Atherosclerosis (ICD10-I70.0). Electronically Signed   By: Marin Roberts M.D.   On: 07/23/2023 22:25   CT HEAD CODE STROKE WO CONTRAST  Result Date: 07/23/2023 CLINICAL DATA:  Code stroke. Dizziness, left-sided weakness, facial droop, slurred speech EXAM: CT HEAD WITHOUT CONTRAST TECHNIQUE: Contiguous axial images were obtained from the base of the skull through the vertex without intravenous contrast. RADIATION DOSE REDUCTION: This exam was performed according to the departmental dose-optimization program which includes automated exposure control, adjustment of the mA and/or kV according to patient size and/or use of iterative reconstruction technique. COMPARISON:  02/04/2023 FINDINGS: Brain: No evidence of acute infarction, hemorrhage, mass, mass effect, or midline shift. No hydrocephalus or  extra-axial collection. Remote lacunar infarcts in the right pons and right cerebellum. Periventricular white matter changes, likely the sequela of chronic small vessel ischemic disease. Vascular: No hyperdense vessel. Atherosclerotic calcifications in the intracranial carotid and vertebral arteries. Skull: Negative for fracture or focal lesion. Sinuses/Orbits: Mucosal thickening in the  ethmoid air cells. No acute finding in the orbits. Other: The mastoid air cells are well aerated. ASPECTS [facility] Stroke Program Early CT Score) - Ganglionic level infarction (caudate, lentiform nuclei, internal capsule, insula, M1-M3 cortex): 7 - Supraganglionic infarction (M4-M6 cortex): 3 Total score (0-10 with 10 being normal): 10 IMPRESSION: 1. No acute intracranial process. 2. ASPECTS is 10. Code stroke imaging results were communicated on 07/23/2023 at 8:18 pm to provider [email or web address] via telephone, who verbally acknowledged these results. Electronically Signed   By: Wiliam Ke M.D.   On: 07/23/2023 20:18    Time coordinating discharge: 40 mins  SIGNED:  Carollee Herter, DO [facility] 07/24/23, 3:36 PM

## 2023-07-24 NOTE — Progress Notes (Signed)
*  PRELIMINARY RESULTS* Echocardiogram 2D Echocardiogram has been performed.  Carolyne Fiscal 07/24/2023, 12:44 PM

## 2023-07-24 NOTE — Evaluation (Signed)
Occupational Therapy Evaluation Patient Details Name: Robin Chandler MRN: [id number] DOB: 1961/04/23 Today's Date: 07/24/2023   History of Present Illness Jung Burback is a 62 y.o. female with medical history significant of diabetes mellitus type 2, hypertension, history of stroke, presents the ED with a chief complaint of slurred speech and dizziness.  Patient reports around 2 PM she started to feel unsteady on her feet.  She reports when she woke up that morning she was totally normal, and even normal when she went out to eat.  She laid down and took a nap, when she woke up she noticed her legs were heavy.  Her symptoms are getting worse.  Her speech was more slurred.  She felt more unsteady.  She reports she could not sit up.  She had no headache.  She denies asymmetrical weakness at that time, but then reports that her left foot is dragging when she walks since she has been in the ED.  She reports this is the same symptoms she had with her stroke in April, and "it is back."  Patient reports after the stroke in April she did her therapy and stayed out of work like she was supposed to and her left leg weakness was almost gone.  Patient reports that her slurred speech is resolved.  She still has a bit of a heavy feeling in her legs.  She still feels a little unsteady.  Overall her symptoms have improved.  Patient has no other complaints at this time. (per DO)   Clinical Impression   Pt agreeable to OT and PT co-evaluation. Pt reports feeling back to baseline. Pt was able to ambulate in the hall and complete toileting independently. Pt demonstrates mild L wrist weakness but nothing of much significance. Pt also has history of stroke with L side weakness. Pt was left in the bed with call bell within reach. Pt is not recommended for further acute OT services and will be discharged to care of nursing staff for remaining length of stay.             Functional Status Assessment  Patient has not had a  recent decline in their functional status  Equipment Recommendations  None recommended by OT           Precautions / Restrictions Precautions Precautions: Fall Restrictions Weight Bearing Restrictions: No      Mobility Bed Mobility Overal bed mobility: Independent                  Transfers Overall transfer level: Independent                        Balance Overall balance assessment: Independent                                         ADL either performed or assessed with clinical judgement   ADL Overall ADL's : Independent                                             Vision Baseline Vision/History: 1 Wears glasses Ability to See in Adequate Light: 1 Impaired Patient Visual Report: No change from baseline Vision Assessment?: Wears glasses for reading     Perception Perception: Not tested  Praxis Praxis: WFL       Pertinent Vitals/Pain Pain Assessment Pain Assessment: No/denies pain     Extremity/Trunk Assessment Upper Extremity Assessment Upper Extremity Assessment: Overall WFL for tasks assessed (Mild L LE wrist weakness at 4+/5.)   Lower Extremity Assessment Lower Extremity Assessment: Defer to PT evaluation   Cervical / Trunk Assessment Cervical / Trunk Assessment: Normal   Communication Communication Communication: No apparent difficulties   Cognition Arousal: Alert Behavior During Therapy: WFL for tasks assessed/performed Overall Cognitive Status: Within Functional Limits for tasks assessed                                                        Home Living Family/patient expects to be discharged to:: Private residence Living Arrangements: Other relatives Available Help at Discharge: Family;Available PRN/intermittently Type of Home: House Home Access: Stairs to enter [employer] of Steps: 10 Entrance Stairs-Rails: Can reach both;Right;Left Home  Layout: One level     Bathroom Shower/Tub: Chief Strategy Officer: Standard Bathroom Accessibility: No   Home Equipment:  (Pt reported having no equipment.)   Additional Comments: per chart review and pt report      Prior Functioning/Environment Prior Level of Function : Independent/Modified Independent             Mobility Comments: Community ambulator without AD; drives ADLs Comments: Independent                                Co-evaluation PT/OT/SLP Co-Evaluation/Treatment: Yes Reason for Co-Treatment: To address functional/ADL transfers   OT goals addressed during session: ADL's and self-care      AM-PAC OT "6 Clicks" Daily Activity     Outcome Measure Help from another person eating meals?: None Help from another person taking care of personal grooming?: None Help from another person toileting, which includes using toliet, bedpan, or urinal?: None Help from another person bathing (including washing, rinsing, drying)?: None Help from another person to put on and taking off regular upper body clothing?: None Help from another person to put on and taking off regular lower body clothing?: None 6 Click Score: 24   End of Session    Activity Tolerance: Patient tolerated treatment well Patient left: in bed;with call bell/phone within reach  OT Visit Diagnosis: Unsteadiness on feet (R26.81);Other symptoms and signs involving the nervous system (I69.629)                Time: 5284-1324 OT Time Calculation (min): 13 min Charges:  OT General Charges $OT Visit: 1 Visit OT Evaluation $OT Eval Low Complexity: 1 Low  Eris Hannan OT, MOT  Danie Chandler 07/24/2023, 9:22 AM

## 2023-07-24 NOTE — ED Notes (Signed)
Took pt all new warm blankets as she was cold from the ones she received over night. She is concerned as to whether she is staying or going to be discharged at this time. Informed her that we are waiting on MRI results and will have to go from there.

## 2023-07-24 NOTE — ED Notes (Signed)
Exam beign carried on in pt room at this time and has been for last 20-30 min. Will administer meds when possible

## 2023-07-24 NOTE — Hospital Course (Signed)
HPI: Robin Chandler is a 62 y.o. female with medical history significant of diabetes mellitus type 2, hypertension, history of stroke, presents the ED with a chief complaint of slurred speech and dizziness.  Patient reports around 2 PM she started to feel unsteady on her feet.  She reports when she woke up that morning she was totally normal, and even normal when she went out to eat.  She laid down and took a nap, when she woke up she noticed her legs were heavy.  Her symptoms are getting worse.  Her speech was more slurred.  She felt more unsteady.  She reports she could not sit up.  She had no headache.  She denies asymmetrical weakness at that time, but then reports that her left foot is dragging when she walks since she has been in the ED.  She reports this is the same symptoms she had with her stroke in April, and "it is back."  Patient reports after the stroke in April she did her therapy and stayed out of work like she was supposed to and her left leg weakness was almost gone.  Patient reports that her slurred speech is resolved.  She still has a bit of a heavy feeling in her legs.  She still feels a little unsteady.  Overall her symptoms have improved.  Patient has no other complaints at this time.   Significant Events: Admitted 07/23/2023 for TIA vs CVA MRI brain showed Acute infarcts involving the body of the caudate and the lentiform nucleus on the left. No hemorrhage.  Significant Labs: Lipid panel pending  Significant Imaging Studies: MRI brain showed Acute infarcts involving the body of the caudate and the lentiform nucleus on the left. No hemorrhage. Echo LVEF 65%, mild/moderate aortic stenosis. Mean gradient 11, peak gradient 22.5, AVA 1.45 cm2. No interatrial shunt  Antibiotic Therapy: Anti-infectives (From admission, onward)    None       Procedures:   Consultants: Neurology

## 2023-07-24 NOTE — Assessment & Plan Note (Signed)
Continue BuSpar.

## 2023-07-24 NOTE — Assessment & Plan Note (Addendum)
Lisinopril held on admission per neurology advice  07-24-2023 neurology wants normotension at this point. Pt's HTN meds not changed

## 2023-07-24 NOTE — Assessment & Plan Note (Signed)
Seen on echo. Pt without any symptoms. Will need yearly followup by PCP. Consider cards referral as outpatient.

## 2023-07-24 NOTE — Consult Note (Signed)
I connected with  Robin Chandler on 07/24/23 by a video enabled telemedicine application and verified that I am speaking with the correct person using two identifiers.   I discussed the limitations of evaluation and management by telemedicine. The patient expressed understanding and agreed to proceed.  [facility]: [facility]   Neurology Consultation Reason for Consult: Stroke Referring Physician: Dr. Carollee Herter  CC: Dizziness/gait instability  History is obtained from: Patient, chart review  HPI: Robin Chandler is a 62 y.o. female with prior CVA in April 2024 with mild residual left-sided weakness, hypertension, diabetes, hyperlipidemia, nicotine use disorder who presented with dizziness as well as gait instability.  Patient states yesterday afternoon around 2 PM she started feeling dizzy described as gait instability.  She eventually came to the hospital in the evening.  MRI brain showed acute ischemic stroke in caudate and the lentiform nucleus on the left.  Therefore neurology was consulted.  Of note patient used to take aspirin 81 mg daily and was switched to Plavix 75 mg daily in April.  Reports compliance with Plavix 75 mg daily.  Last known normal: 10/40/2024 around 2 PM Event happened at home No tPA as outside window No thrombectomy as no large vessel occlusion mRS 0   ROS: All other systems reviewed and negative except as noted in the HPI.  Past Medical History:  Diagnosis Date   Diabetes mellitus without complication (HCC)    Hypertension     No family history on file.  Social History:  reports that she has been smoking. She has never used smokeless tobacco. She reports that she does not drink alcohol and does not use drugs.   Exam: Current vital signs: BP (!) 150/80   Pulse 70   Temp 97.9 F (36.6 C) (Oral)   Resp 20   Ht 5\' 4"  (1.626 m)   Wt 72.6 kg   SpO2 100%   BMI 27.46 kg/m  Vital signs in last 24  hours: Temp:  [97.6 F (36.4 C)-98 F (36.7 C)] 97.9 F (36.6 C) (10/15 0918) Pulse Rate:  [64-90] 70 (10/15 0700) Resp:  [11-24] 20 (10/15 0600) BP: (131-157)/(58-80) 150/80 (10/15 0700) SpO2:  [91 %-100 %] 100 % (10/15 0700) Weight:  [72.6 kg] 72.6 kg (10/14 2003)   Physical Exam  Constitutional: Appears well-developed and well-nourished.  Psych: Affect appropriate to situation Neuro:  AOx3, no aphasia, cranial nerves appear grossly intact, antigravity send without drift in all 4 extremities, sensation intact to light touch, FTN intact bilaterally   NIHSS 0  I have reviewed labs in epic and the results pertinent to this consultation are: CBC:  Recent Labs  Lab 07/23/23 2024 07/24/23 0515  WBC 6.7 8.9  NEUTROABS 3.6 5.3  HGB 11.4* 11.6*  HCT 35.6* 37.0  MCV 88.6 87.9  PLT 337 352    Basic Metabolic Panel:  Lab Results  Component Value Date   NA 139 07/24/2023   K 4.3 07/24/2023   CO2 26 07/24/2023   GLUCOSE 99 07/24/2023   BUN 21 07/24/2023   CREATININE 1.07 (H) 07/24/2023   CALCIUM 9.2 07/24/2023   GFRNONAA 58 (L) 07/24/2023   GFRAA >60 07/08/2019   Lipid Panel:  Lab Results  Component Value Date   LDLCALC 66 05/16/2023   HgbA1c:  Lab Results  Component Value Date   HGBA1C 7.8 (H) 05/16/2023   Urine Drug Screen:     Component Value Date/Time   LABOPIA NONE DETECTED  07/23/2023 2343   COCAINSCRNUR NONE DETECTED 07/23/2023 2343   LABBENZ NONE DETECTED 07/23/2023 2343   AMPHETMU NONE DETECTED 07/23/2023 2343   THCU NONE DETECTED 07/23/2023 2343   LABBARB NONE DETECTED 07/23/2023 2343    Alcohol Level     Component Value Date/Time   ETH <10 07/23/2023 2024     I have reviewed the images obtained:  CT head without contrast 10/40/2024: No acute intracranial process. ASPECTS is 10.   CTA head and neck with and without contrast 10/40/24:  No emergent large vessel occlusion.  Stable atherosclerotic changes at the carotid bifurcations bilaterally  and cavernous internal carotid arteries bilaterally without significant stenosis. Mild narrowing of the left V1 segment. Moderate stenosis of the right P2 segment. No other significant proximal stenosis, aneurysm, or branch vessel occlusion within the Circle of Willis. Normal CT perfusion.  Aortic Atherosclerosis (ICD10-I70.0).  MRI brain without contrast 10/50/24: Acute infarcts involving the body of the caudate and the lentiform nucleus on the left. No hemorrhage.      ASSESSMENT/PLAN: 62 year old with acute ischemic stroke while on Plavix  Acute ischemic stroke -Etiology: Likely small vessel disease versus embolic  Recommendations: -Recommend aspirin 81 mg daily and Brilinta 90 mg twice daily for 30 days followed by aspirin 325 mg daily -Continue atorvastatin 40 mg daily -If TTE negative, recommend 30-day cardiac monitor -PT/OT -Stroke education including BEFAST -Goal blood pressure: Normotension -Follow-up with neurology in 2 to 3 months (order placed) -Plan discussed with patient and daughter at bedside as well as Dr. Imogene Burn via secure chat   Thank you for allowing [place] to participate in the care of this patient. If you have any further questions, please contact  me or neurohospitalist.   Lindie Spruce [facility] neurohospitalist

## 2023-07-24 NOTE — Addendum Note (Signed)
Addended by: Kerney Elbe on: 07/24/2023 04:35 PM   Modules accepted: Orders

## 2023-07-24 NOTE — TOC Progression Note (Signed)
Transition of Care [facility]) - Progression Note    Patient Details  Name: Robin Chandler MRN: [id number] Date of Birth: 11/26/1960  Transition of [facility]) CM/SW Contact  Karn Cassis, [place] Phone Number: 07/24/2023, 3:01 PM  Clinical Narrative: TOC received consult for scheduling follow up appointment. LCSW confirmed ED secretary aware of request.        Barriers to Discharge: Barriers Resolved  Expected Discharge Plan and Services         Expected Discharge Date: 07/24/23                                     Social Determinants of Health (SDOH) Interventions SDOH Screenings   Food Insecurity: No Food Insecurity (02/04/2023)  Housing: Low Risk  (02/04/2023)  Transportation Needs: No Transportation Needs (02/04/2023)  Utilities: Not At Risk (02/04/2023)  Tobacco Use: High Risk (05/16/2023)    Readmission Risk Interventions     No data to display

## 2023-07-24 NOTE — Assessment & Plan Note (Signed)
Continue nicotine patch

## 2023-07-24 NOTE — Progress Notes (Signed)
SLP Cancellation Note  Patient Details Name: Robin Chandler MRN: [id number] DOB: 1961/07/21   Cancelled treatment:       Reason Eval/Treat Not Completed: Other (comment) (Pt passed [facility] swallow screen and diet ordered. Please consult if BSE or SLE desired. SLP will sign off.)  Thank you,  Havery Moros, CCC-SLP [phone]  Keyleen Cerrato 07/24/2023, 12:23 PM

## 2023-07-24 NOTE — Telephone Encounter (Signed)
Request for a live monitor for 30 days. Orders placed.

## 2023-07-24 NOTE — Assessment & Plan Note (Addendum)
07-23-2023  20 units of basal insulin at baseline.  Continue reduced rate of basal insulin here.  Sliding scale coverage. Continue to monitor 07-24-2023 add on HgA1c. PCP can follow up.

## 2023-07-25 ENCOUNTER — Other Ambulatory Visit (HOSPITAL_COMMUNITY): Payer: Self-pay

## 2023-07-25 NOTE — TOC Benefit Eligibility Note (Signed)
Patient Product/process development scientist completed.    The patient is insured through [facility] [employer]. Patient has Medicare and is not eligible for a copay card, but may be able to apply for patient assistance, if available.    Ran test claim for Brilinta 90 mg and the current 30 day co-pay is $4.00.   This test claim was processed through [facility]- copay amounts may vary at other pharmacies due to pharmacy/plan contracts, or as the patient moves through the different stages of their insurance plan.     Roland Earl, CPHT Pharmacy Technician III Certified Patient Advocate [facility] Patient Advocate Team Direct Number: [phone]  Fax: [phone]

## 2023-08-15 DIAGNOSIS — I639 Cerebral infarction, unspecified: Secondary | ICD-10-CM | POA: Diagnosis not present

## 2023-09-14 NOTE — Progress Notes (Signed)
Please forward results to patients neurologist. There were no episodes of atrial fibrillation found on the event monitor. Please make follow up appointment with patient in clinic to establish care related to Aortic Stenosis found during recent hospitalization.

## 2023-10-18 ENCOUNTER — Telehealth: Payer: Self-pay | Admitting: Neurology

## 2023-10-18 NOTE — Telephone Encounter (Signed)
 Called and spoke with patient to reschedule her appointment on 11/12/23. She is rescheduled for 12/13/23. Patient stated she saw her PCP who told her to let Dr. Rosemarie know she changed her medications and wants him to be aware and see if he has any recommendations or change anything. In October when she had a second stroke she was discharged with Brilinta  and Aspirin . Patient stated she could not tolerate Brilinta  and felt awful so she restarted her Plavix  and discontinued Aspirin . Patient states she take Aspirin  81mg  and Plavix  every day.

## 2023-10-18 NOTE — Telephone Encounter (Signed)
 Noted, I will send to Dr Pearlean Brownie as Lorain Childes

## 2023-10-26 ENCOUNTER — Ambulatory Visit: Payer: Medicaid Other | Admitting: Internal Medicine

## 2023-11-06 ENCOUNTER — Encounter (INDEPENDENT_AMBULATORY_CARE_PROVIDER_SITE_OTHER): Payer: Self-pay | Admitting: *Deleted

## 2023-11-12 ENCOUNTER — Inpatient Hospital Stay: Payer: Medicaid Other | Admitting: Neurology

## 2023-12-13 ENCOUNTER — Inpatient Hospital Stay: Payer: Medicaid Other | Admitting: Neurology

## 2024-01-08 ENCOUNTER — Ambulatory Visit: Payer: Medicaid Other | Attending: Internal Medicine | Admitting: Internal Medicine

## 2024-01-08 ENCOUNTER — Encounter: Payer: Self-pay | Admitting: Internal Medicine

## 2024-01-08 NOTE — Progress Notes (Signed)
 Erroneous encounter - please disregard.

## 2024-03-04 ENCOUNTER — Inpatient Hospital Stay: Admitting: Neurology

## 2024-05-23 ENCOUNTER — Ambulatory Visit: Payer: Self-pay | Admitting: Internal Medicine

## 2024-09-18 ENCOUNTER — Encounter: Payer: Self-pay | Admitting: Nurse Practitioner

## 2024-09-24 ENCOUNTER — Telehealth: Payer: Self-pay

## 2024-09-24 ENCOUNTER — Other Ambulatory Visit: Payer: Self-pay | Admitting: *Deleted

## 2024-09-24 ENCOUNTER — Inpatient Hospital Stay
Admission: RE | Admit: 2024-09-24 | Discharge: 2024-09-24 | Disposition: A | Discharge status: Home / Self Care | Payer: Self-pay | Source: Ambulatory Visit | Attending: Nurse Practitioner | Admitting: Nurse Practitioner

## 2024-09-24 DIAGNOSIS — Z1231 Encounter for screening mammogram for malignant neoplasm of breast: Secondary | ICD-10-CM
